# Patient Record
Sex: Female | Born: 1980
Health system: Southern US, Community
[De-identification: ages and names within clinical notes are randomized; demographics above are authoritative.]

## PROBLEM LIST (undated history)

## (undated) DIAGNOSIS — N870 Mild cervical dysplasia: Secondary | ICD-10-CM

## (undated) DIAGNOSIS — R87612 Low grade squamous intraepithelial lesion on cytologic smear of cervix (LGSIL): Secondary | ICD-10-CM

## (undated) DIAGNOSIS — Z789 Other specified health status: Secondary | ICD-10-CM

## (undated) HISTORY — PX: COLPOSCOPY: SHX161

## (undated) HISTORY — PX: REFRACTIVE SURGERY: SHX103

## (undated) HISTORY — DX: Low grade squamous intraepithelial lesion on cytologic smear of cervix (LGSIL): R87.612

## (undated) HISTORY — DX: Mild cervical dysplasia: N87.0

## (undated) HISTORY — DX: Other specified health status: Z78.9

---

## 2002-11-04 ENCOUNTER — Other Ambulatory Visit: Admission: RE | Admit: 2002-11-04 | Discharge: 2002-11-04 | Payer: Self-pay | Admitting: Gynecology

## 2003-11-06 ENCOUNTER — Other Ambulatory Visit: Admission: RE | Admit: 2003-11-06 | Discharge: 2003-11-06 | Payer: Self-pay | Admitting: Gynecology

## 2004-11-29 ENCOUNTER — Other Ambulatory Visit: Admission: RE | Admit: 2004-11-29 | Discharge: 2004-11-29 | Payer: Self-pay | Admitting: Obstetrics and Gynecology

## 2005-08-06 ENCOUNTER — Other Ambulatory Visit: Admission: RE | Admit: 2005-08-06 | Discharge: 2005-08-06 | Payer: Self-pay | Admitting: Gynecology

## 2006-02-11 ENCOUNTER — Other Ambulatory Visit: Admission: RE | Admit: 2006-02-11 | Discharge: 2006-02-11 | Payer: Self-pay | Admitting: Gynecology

## 2007-04-01 ENCOUNTER — Other Ambulatory Visit: Admission: RE | Admit: 2007-04-01 | Discharge: 2007-04-01 | Payer: Self-pay | Admitting: Gynecology

## 2007-06-17 DIAGNOSIS — Z789 Other specified health status: Secondary | ICD-10-CM

## 2007-06-17 HISTORY — DX: Other specified health status: Z78.9

## 2007-07-07 ENCOUNTER — Other Ambulatory Visit: Admission: RE | Admit: 2007-07-07 | Discharge: 2007-07-07 | Payer: Self-pay | Admitting: Gynecology

## 2009-05-02 ENCOUNTER — Other Ambulatory Visit: Admission: RE | Admit: 2009-05-02 | Discharge: 2009-05-02 | Payer: Self-pay | Admitting: Gynecology

## 2009-05-02 ENCOUNTER — Ambulatory Visit: Payer: Self-pay | Admitting: Women's Health

## 2010-10-24 ENCOUNTER — Other Ambulatory Visit (HOSPITAL_COMMUNITY)
Admission: RE | Admit: 2010-10-24 | Discharge: 2010-10-24 | Disposition: A | Discharge status: Home / Self Care | Payer: PRIVATE HEALTH INSURANCE | Source: Ambulatory Visit | Attending: Gynecology | Admitting: Gynecology

## 2010-10-24 ENCOUNTER — Encounter (INDEPENDENT_AMBULATORY_CARE_PROVIDER_SITE_OTHER): Payer: PRIVATE HEALTH INSURANCE | Admitting: Women's Health

## 2010-10-24 ENCOUNTER — Other Ambulatory Visit: Payer: Self-pay | Admitting: Gynecology

## 2010-10-24 DIAGNOSIS — Z01419 Encounter for gynecological examination (general) (routine) without abnormal findings: Secondary | ICD-10-CM

## 2010-10-24 DIAGNOSIS — B3731 Acute candidiasis of vulva and vagina: Secondary | ICD-10-CM

## 2010-10-24 DIAGNOSIS — B373 Candidiasis of vulva and vagina: Secondary | ICD-10-CM

## 2010-10-24 DIAGNOSIS — Z124 Encounter for screening for malignant neoplasm of cervix: Secondary | ICD-10-CM | POA: Insufficient documentation

## 2010-11-26 ENCOUNTER — Ambulatory Visit (INDEPENDENT_AMBULATORY_CARE_PROVIDER_SITE_OTHER): Payer: Self-pay | Admitting: Women's Health

## 2010-11-26 DIAGNOSIS — N898 Other specified noninflammatory disorders of vagina: Secondary | ICD-10-CM

## 2010-11-26 DIAGNOSIS — B373 Candidiasis of vulva and vagina: Secondary | ICD-10-CM

## 2011-02-21 ENCOUNTER — Telehealth: Payer: Self-pay | Admitting: *Deleted

## 2011-02-21 ENCOUNTER — Ambulatory Visit (INDEPENDENT_AMBULATORY_CARE_PROVIDER_SITE_OTHER): Payer: 59 | Admitting: Gynecology

## 2011-02-21 ENCOUNTER — Encounter: Payer: Self-pay | Admitting: Gynecology

## 2011-02-21 DIAGNOSIS — R350 Frequency of micturition: Secondary | ICD-10-CM

## 2011-02-21 DIAGNOSIS — R82998 Other abnormal findings in urine: Secondary | ICD-10-CM

## 2011-02-21 DIAGNOSIS — N390 Urinary tract infection, site not specified: Secondary | ICD-10-CM

## 2011-02-21 MED ORDER — SULFAMETHOXAZOLE-TRIMETHOPRIM 800-160 MG PO TABS: 1.0000 | ORAL_TABLET | Freq: Two times a day (BID) | ORAL | Status: AC

## 2011-02-21 NOTE — Progress Notes (Signed)
Patient presents with classic UTI symptoms of one day duration of frequency and mild dysuria. No fevers or chills nausea vomiting or low back pain. She does note that her urine is also darker. She does have a history of UTIs in the past.  Exam Back: Spine straight no CVA tenderness Abdominal: Soft nontender without masses guarding rebound organomegaly  urinalysis: 2-3 WBC 1-2 RBC few epithelial trace bacteria positive leukocyte esterase small amount of blood.  Assessment and plan: Early UTI. Will treat with Septra DS 1 by mouth twice a day x3 days follow up if symptoms persist or recur.

## 2011-02-21 NOTE — Telephone Encounter (Signed)
Pt called stating pharmacy never got medication for septra ds. Rx [facility].

## 2011-08-11 ENCOUNTER — Telehealth: Payer: Self-pay | Admitting: *Deleted

## 2011-08-11 ENCOUNTER — Other Ambulatory Visit: Payer: Self-pay | Admitting: Women's Health

## 2011-08-11 MED ORDER — ETONOGESTREL-ETHINYL ESTRADIOL 0.12-0.015 MG/24HR VA RING: VAGINAL_RING | VAGINAL | Status: DC

## 2011-08-11 NOTE — Telephone Encounter (Signed)
Pt called requesting to start back on birth control. She used nuvaring in the past and would like to start back on it. Her LMP: around the week of Jan 28. Pt said she couldn't remember. According to chart looks like her last use of nuvaring was back in 2010. Please advise

## 2011-08-11 NOTE — Telephone Encounter (Signed)
Telephone call to review request. States cycles are irregular every 3-5 weeks some lasting 5-7 days and would like to get back on nuva ring for cycle control. Husband with vasectomy. Start up instructions reviewed will call if her cycle does not regulate, will evaluate at annual exam in May. Nonsmoker

## 2011-08-18 ENCOUNTER — Encounter: Payer: Self-pay | Admitting: Women's Health

## 2011-08-18 ENCOUNTER — Ambulatory Visit (INDEPENDENT_AMBULATORY_CARE_PROVIDER_SITE_OTHER): Payer: 59 | Admitting: Women's Health

## 2011-08-18 DIAGNOSIS — N898 Other specified noninflammatory disorders of vagina: Secondary | ICD-10-CM

## 2011-08-18 DIAGNOSIS — L293 Anogenital pruritus, unspecified: Secondary | ICD-10-CM

## 2011-08-18 LAB — WET PREP FOR TRICH, YEAST, CLUE
Clue Cells Wet Prep HPF POC: NONE SEEN
Trich, Wet Prep: NONE SEEN
Yeast Wet Prep HPF POC: NONE SEEN

## 2011-08-18 MED ORDER — FLUCONAZOLE 100 MG PO TABS: 100.0000 mg | ORAL_TABLET | Freq: Every day | ORAL | Status: AC

## 2011-08-18 NOTE — Progress Notes (Signed)
Patient ID: Kelly Tapia, female   DOB: 09-16-1980, 31 y.o.   MRN: [id number] Presents with the complaint of external vaginal itching, denies any odor. Took  Diflucan twice last week 75% better but continues with some itching, cycle started today. History of yeast in the past. Husband with vasectomy, uses nuva ring for dysmenorrhea with good relief. Denies any urinary symptoms.  Exam: External genitalia slightly erythematous. Speculum exam moderate amount of menses-type blood noted vaginal walls also erythematous. Wet prep negative for yeast. Bimanual no CMT or adnexal fullness or tenderness.  Yeast symptoms  Plan: Diflucan 200 for 3 days days, instructed to call office if no relief.

## 2011-11-14 ENCOUNTER — Other Ambulatory Visit: Payer: Self-pay | Admitting: *Deleted

## 2011-11-14 MED ORDER — ETONOGESTREL-ETHINYL ESTRADIOL 0.12-0.015 MG/24HR VA RING: VAGINAL_RING | VAGINAL | Status: DC

## 2011-12-04 ENCOUNTER — Ambulatory Visit (INDEPENDENT_AMBULATORY_CARE_PROVIDER_SITE_OTHER): Payer: 59 | Admitting: Women's Health

## 2011-12-04 ENCOUNTER — Other Ambulatory Visit (HOSPITAL_COMMUNITY)
Admission: RE | Admit: 2011-12-04 | Discharge: 2011-12-04 | Disposition: A | Discharge status: Home / Self Care | Payer: 59 | Source: Ambulatory Visit | Attending: Women's Health | Admitting: Women's Health

## 2011-12-04 ENCOUNTER — Encounter: Payer: Self-pay | Admitting: Women's Health

## 2011-12-04 VITALS — BP 116/80 | Ht 67.0 in | Wt 184.0 lb

## 2011-12-04 DIAGNOSIS — Z01419 Encounter for gynecological examination (general) (routine) without abnormal findings: Secondary | ICD-10-CM

## 2011-12-04 DIAGNOSIS — Z1159 Encounter for screening for other viral diseases: Secondary | ICD-10-CM | POA: Insufficient documentation

## 2011-12-04 DIAGNOSIS — Z833 Family history of diabetes mellitus: Secondary | ICD-10-CM

## 2011-12-04 DIAGNOSIS — N946 Dysmenorrhea, unspecified: Secondary | ICD-10-CM

## 2011-12-04 MED ORDER — ETONOGESTREL-ETHINYL ESTRADIOL 0.12-0.015 MG/24HR VA RING: VAGINAL_RING | VAGINAL | Status: DC

## 2011-12-04 NOTE — Patient Instructions (Signed)

## 2011-12-04 NOTE — Progress Notes (Signed)
Kelly Tapia May 07, 1981 [id number]    History:    The patient presents for annual exam.  Monthly 4 days cycle on nuva  ring/dysmenorrhea. Husband vasectomy. 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 Has not had gardasil vaccine.   Past medical history, past surgical history, family history and social history were all reviewed and documented in the EPIC chart. 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   ROS:  A  ROS was performed and pertinent positives and negatives are included in the history.  Exam:  Filed Vitals:   12/04/11 1424  BP: 116/80    General appearance:  Normal Head/Neck:  Normal, without cervical or supraclavicular adenopathy. Thyroid:  Symmetrical, normal in size, without palpable masses or nodularity. Respiratory  Effort:  Normal  Auscultation:  Clear without wheezing or rhonchi Cardiovascular  Auscultation:  Regular rate, without rubs, murmurs or gallops  Edema/varicosities:  Not grossly evident Abdominal  Soft,nontender, without masses, guarding or rebound.  Liver/spleen:  No organomegaly noted  Hernia:  None appreciated  Skin  Inspection:  Grossly normal  Palpation:  Grossly normal Neurologic/psychiatric  Orientation:  Normal with appropriate conversation.  Mood/affect:  Normal  Genitourinary    Breasts: Examined lying and sitting.     Right: Without masses, retractions, discharge or axillary adenopathy.     Left: Without masses, retractions, discharge or axillary adenopathy.   Inguinal/mons:  Normal without inguinal adenopathy  External genitalia:  Normal  BUS/Urethra/Skene's glands:  Normal  Bladder:  Normal  Vagina:  Normal  Cervix:  Normal  Uterus:   normal in size, shape and contour.  Midline and mobile  Adnexa/parametria:     Rt: Without masses or tenderness.   Lt: Without masses or tenderness.  Anus and perineum: Normal  Digital rectal exam: Normal sphincter tone without palpated masses or  tenderness  Assessment/Plan:  31 y.o. M. WF G0 for annual exam with no complaints.  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 SBE's, exercise, calcium rich diet, MVI daily encouraged. CBC, glucose, UA and Pap with HR HPV typing. Reviewed new Pap screening guidelines.     Harrington Challenger [facility], 5:55 PM 12/04/2011

## 2011-12-05 LAB — URINALYSIS W MICROSCOPIC + REFLEX CULTURE
Hgb urine dipstick: NEGATIVE
Leukocytes, UA: NEGATIVE
Protein, ur: NEGATIVE mg/dL
Squamous Epithelial / LPF: NONE SEEN
Urobilinogen, UA: 0.2 mg/dL (ref 0.0–1.0)

## 2011-12-05 LAB — CBC WITH DIFFERENTIAL/PLATELET
Basophils Absolute: 0 10*3/uL (ref 0.0–0.1)
Basophils Relative: 0 % (ref 0–1)
Eosinophils Absolute: 0.1 10*3/uL (ref 0.0–0.7)
Eosinophils Relative: 1 % (ref 0–5)
Lymphs Abs: 2.5 10*3/uL (ref 0.7–4.0)
MCH: 30.9 pg (ref 26.0–34.0)
MCHC: 33.2 g/dL (ref 30.0–36.0)
MCV: 93 fL (ref 78.0–100.0)
Platelets: 347 10*3/uL (ref 150–400)
RDW: 13.3 % (ref 11.5–15.5)

## 2012-09-16 ENCOUNTER — Telehealth: Payer: Self-pay | Admitting: *Deleted

## 2012-09-16 NOTE — Telephone Encounter (Signed)
Pt takes nuvaring currently, wants to switch birth control. Pt told to make OV, transferred to front desk.

## 2012-09-23 ENCOUNTER — Ambulatory Visit: Payer: Self-pay | Admitting: Women's Health

## 2012-11-19 ENCOUNTER — Ambulatory Visit (INDEPENDENT_AMBULATORY_CARE_PROVIDER_SITE_OTHER): Payer: 59 | Admitting: Sports Medicine

## 2012-11-19 ENCOUNTER — Encounter: Payer: Self-pay | Admitting: Sports Medicine

## 2012-11-19 VITALS — BP 125/77 | HR 89 | Wt 196.0 lb

## 2012-11-19 DIAGNOSIS — C44612 Basal cell carcinoma of skin of right upper limb, including shoulder: Secondary | ICD-10-CM | POA: Insufficient documentation

## 2012-11-19 DIAGNOSIS — Z299 Encounter for prophylactic measures, unspecified: Secondary | ICD-10-CM

## 2012-11-19 DIAGNOSIS — L989 Disorder of the skin and subcutaneous tissue, unspecified: Secondary | ICD-10-CM

## 2012-11-19 NOTE — Progress Notes (Signed)
  Subjective:    CC: Establish care.   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 She is very healthy and has no complaints with the exception of a lesion that she is noted on her back.  Mole: Presents on the left mid back, growing slightly per patient, it does not bleed, it does not cause pain, she wonders if it could be a skin cancer.  Preventive measures: Up-to-date on everything including Pap smears.  Past medical history, Surgical history, Family history not pertinant except as noted below, Social history, Allergies, and medications have been entered into the medical record, reviewed, and no changes needed.   Review of Systems: No headache, visual changes, nausea, vomiting, diarrhea, constipation, dizziness, abdominal pain, skin rash, fevers, chills, night sweats, swollen lymph nodes, weight loss, chest pain, body aches, joint swelling, muscle aches, shortness of breath, mood changes, visual or auditory hallucinations.  Objective:    General: Well Developed, well nourished, and in no acute distress.  Neuro: Alert and oriented x3, extra-ocular muscles intact, sensation grossly intact.  HEENT: Normocephalic, atraumatic, pupils equal round reactive to light, neck supple, no masses, no lymphadenopathy, thyroid nonpalpable.  Skin: Warm and dry, no rashes noted.  There is a 1 cm plaque on her left mid back that does have a variegated appearance, it is slightly raised and does take on the appearance of a seborrheic keratosis, but does not have well-defined borders. Cardiac: Regular rate and rhythm, no murmurs rubs or gallops.  Respiratory: Clear to auscultation bilaterally. Not using accessory muscles, speaking in full sentences.  Abdominal: Soft, nontender, nondistended, positive bowel sounds, no masses, no organomegaly.  Musculoskeletal: Shoulder, elbow, wrist, hip, knee, ankle stable, and with full range of  motion.  Impression and Recommendations:    The patient was counselled, risk factors were discussed, anticipatory guidance given.

## 2012-11-19 NOTE — Assessment & Plan Note (Signed)
Up-to-date on Pap smear. Checking routine blood work.

## 2012-11-19 NOTE — Assessment & Plan Note (Signed)
This does have benign features however because it has been growing I am going to perform excisional biopsy. She will come back to see me at my next available 15 minute slot to do this procedure.

## 2012-12-11 LAB — LIPID PANEL
Cholesterol: 147 mg/dL (ref 0–200)
HDL: 48 mg/dL (ref >39)
LDL Cholesterol: 88 mg/dL (ref 0–99)
Total CHOL/HDL Ratio: 3.1 ratio
Triglycerides: 55 mg/dL (ref <150)
VLDL: 11 mg/dL (ref 0–40)

## 2012-12-11 LAB — COMPREHENSIVE METABOLIC PANEL WITH GFR
ALT: 29 U/L (ref 0–35)
AST: 21 U/L (ref 0–37)
Albumin: 4 g/dL (ref 3.5–5.2)
Alkaline Phosphatase: 51 U/L (ref 39–117)
BUN: 14 mg/dL (ref 6–23)
Creat: 0.9 mg/dL (ref 0.50–1.10)
Potassium: 5.3 meq/L (ref 3.5–5.3)

## 2012-12-11 LAB — CBC
HCT: 37.2 % (ref 36.0–46.0)
Hemoglobin: 12.6 g/dL (ref 12.0–15.0)
MCH: 30.6 pg (ref 26.0–34.0)
MCHC: 33.9 g/dL (ref 30.0–36.0)
MCV: 90.3 fL (ref 78.0–100.0)
Platelets: 313 K/uL (ref 150–400)
RBC: 4.12 MIL/uL (ref 3.87–5.11)
RDW: 12.8 % (ref 11.5–15.5)
WBC: 5.2 10*3/uL (ref 4.0–10.5)

## 2012-12-11 LAB — COMPREHENSIVE METABOLIC PANEL
CO2: 27 mEq/L (ref 19–32)
Calcium: 9.2 mg/dL (ref 8.4–10.5)
Chloride: 106 mEq/L (ref 96–112)
Glucose, Bld: 91 mg/dL (ref 70–99)
Sodium: 139 mEq/L (ref 135–145)
Total Bilirubin: 0.5 mg/dL (ref 0.3–1.2)
Total Protein: 6.6 g/dL (ref 6.0–8.3)

## 2012-12-11 LAB — TSH: TSH: 0.723 u[IU]/mL (ref 0.350–4.500)

## 2012-12-12 LAB — HEMOGLOBIN A1C
Hgb A1c MFr Bld: 5.2 % (ref <5.7)
Mean Plasma Glucose: 103 mg/dL (ref <117)

## 2012-12-13 LAB — VITAMIN D 25 HYDROXY (VIT D DEFICIENCY, FRACTURES): Vit D, 25-Hydroxy: 51 ng/mL (ref 30–89)

## 2012-12-22 ENCOUNTER — Other Ambulatory Visit (HOSPITAL_COMMUNITY)
Admission: RE | Admit: 2012-12-22 | Discharge: 2012-12-22 | Disposition: A | Discharge status: Home / Self Care | Payer: 59 | Source: Ambulatory Visit | Attending: Gynecology | Admitting: Gynecology

## 2012-12-22 ENCOUNTER — Encounter: Payer: Self-pay | Admitting: Women's Health

## 2012-12-22 ENCOUNTER — Ambulatory Visit (INDEPENDENT_AMBULATORY_CARE_PROVIDER_SITE_OTHER): Payer: 59 | Admitting: Women's Health

## 2012-12-22 VITALS — BP 114/72 | Ht 66.5 in | Wt 197.0 lb

## 2012-12-22 DIAGNOSIS — Z01419 Encounter for gynecological examination (general) (routine) without abnormal findings: Secondary | ICD-10-CM | POA: Insufficient documentation

## 2012-12-22 DIAGNOSIS — N920 Excessive and frequent menstruation with regular cycle: Secondary | ICD-10-CM

## 2012-12-22 DIAGNOSIS — N898 Other specified noninflammatory disorders of vagina: Secondary | ICD-10-CM

## 2012-12-22 LAB — WET PREP FOR TRICH, YEAST, CLUE
Trich, Wet Prep: NONE SEEN
Yeast Wet Prep HPF POC: NONE SEEN

## 2012-12-22 MED ORDER — METRONIDAZOLE 0.75 % VA GEL: VAGINAL | Status: DC

## 2012-12-22 NOTE — Patient Instructions (Addendum)

## 2012-12-22 NOTE — Progress Notes (Signed)
Kelly Tapia [birth year] [id number]    History:    The patient presents for annual exam.  Monthly cycle for 7-9 days, 7 are heavy using pads and tampons and changing every one to 2 hours. Husband vasectomy desires no children. History of CIN-1 in 2006, benign biopsy with equivocal HR HPV in 2007. Normal CBC, TSH, glucose at primary care 11/2012.  Past medical history, past surgical history, family history and social history were all reviewed and documented in the EPIC chart. 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  ROS:  A  ROS was performed and pertinent positives and negatives are included in the history.  Exam:  Filed Vitals:   12/22/12 1558  BP: 114/72    General appearance:  Normal Head/Neck:  Normal, without cervical or supraclavicular adenopathy. Thyroid:  Symmetrical, normal in size, without palpable masses or nodularity. Respiratory  Effort:  Normal  Auscultation:  Clear without wheezing or rhonchi Cardiovascular  Auscultation:  Regular rate, without rubs, murmurs or gallops  Edema/varicosities:  Not grossly evident Abdominal  Soft,nontender, without masses, guarding or rebound.  Liver/spleen:  No organomegaly noted  Hernia:  None appreciated  Skin  Inspection:  Grossly normal  Palpation:  Grossly normal Neurologic/psychiatric  Orientation:  Normal with appropriate conversation.  Mood/affect:  Normal  Genitourinary    Breasts: Examined lying and sitting.     Right: Without masses, retractions, discharge or axillary adenopathy.     Left: Without masses, retractions, discharge or axillary adenopathy.   Inguinal/mons:  Normal without inguinal adenopathy  External genitalia:  Normal/ piercing  BUS/Urethra/Skene's glands:  Normal  Bladder:  Normal  Vagina:  Milky discharge, wet prep positive for clues and TNTC bacteria  Cervix:  Normal  Uterus:   normal in size, shape and contour.  Midline and mobile  Adnexa/parametria:     Rt: Without masses or  tenderness.   Lt: Without masses or tenderness.  Anus and perineum: Normal  Digital rectal exam: Normal sphincter tone without palpated masses or tenderness  Assessment/Plan:  32 y.o. MWF G0 for annual exam.     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 Reviewed fertility, importance of 100% being sure of no desire to conceive. Schedule sonohysterogram with Dr. Audie Box after next cycle. Her option ablation information given and reviewed. SBE's, continue regular exercise, calcium rich diet, decrease calories for weight loss, sun screen, MVI daily encouraged. MetroGel vaginal cream 1 applicator at bedtime x5, alcohol for cautions reviewed. Prolactin,  Pap.    Harrington Challenger [facility], 4:54 PM 12/22/2012

## 2013-01-07 ENCOUNTER — Other Ambulatory Visit: Payer: Self-pay | Admitting: Women's Health

## 2013-01-07 NOTE — Telephone Encounter (Signed)
Telephone call, at office visit her option ablation discussed due to menorrhagia, almost $3000 out-of-pocket, will use NuvaRing again. Reviewed risks of blood clots and strokes, nonsmoker.

## 2013-01-10 ENCOUNTER — Encounter: Payer: Self-pay | Admitting: Sports Medicine

## 2013-01-10 ENCOUNTER — Ambulatory Visit (INDEPENDENT_AMBULATORY_CARE_PROVIDER_SITE_OTHER): Payer: 59 | Admitting: Sports Medicine

## 2013-01-10 VITALS — BP 133/70 | HR 74 | Wt 197.0 lb

## 2013-01-10 DIAGNOSIS — R309 Painful micturition, unspecified: Secondary | ICD-10-CM

## 2013-01-10 DIAGNOSIS — R350 Frequency of micturition: Secondary | ICD-10-CM

## 2013-01-10 DIAGNOSIS — N3090 Cystitis, unspecified without hematuria: Secondary | ICD-10-CM

## 2013-01-10 DIAGNOSIS — R300 Dysuria: Secondary | ICD-10-CM

## 2013-01-10 LAB — POCT URINALYSIS DIPSTICK
Bilirubin, UA: NEGATIVE
Glucose, UA: NEGATIVE
Ketones, UA: NEGATIVE
Nitrite, UA: NEGATIVE
Protein, UA: NEGATIVE
Spec Grav, UA: 1.01
Urobilinogen, UA: 0.2
pH, UA: 7

## 2013-01-10 MED ORDER — FLUCONAZOLE 150 MG PO TABS: 150.0000 mg | ORAL_TABLET | Freq: Once | ORAL | Status: DC

## 2013-01-10 MED ORDER — CEPHALEXIN 500 MG PO CAPS: 500.0000 mg | ORAL_CAPSULE | Freq: Two times a day (BID) | ORAL | Status: DC

## 2013-01-10 MED ORDER — PHENAZOPYRIDINE HCL 200 MG PO TABS: 200.0000 mg | ORAL_TABLET | Freq: Three times a day (TID) | ORAL | Status: AC

## 2013-01-10 NOTE — Progress Notes (Signed)
  Subjective:    CC: Dysuria  HPI: This pleasant young lady has had a couple of days of dysuria, frequency, urgency. No flank pain, no fevers or chills, symptoms are mild, persistent, worsening. They feel like prior UTIs, she does get yeast infections with antibiotics. She is an allergy to penicillin, when she was a child, and consisted only of a rash. She has had Keflex in the past without any problems.  Past medical history, Surgical history, Family history not pertinant except as noted below, Social history, Allergies, and medications have been entered into the medical record, reviewed, and no changes needed.   Review of Systems: No fevers, chills, night sweats, weight loss, chest pain, or shortness of breath.   Objective:    General: Well Developed, well nourished, and in no acute distress.  Neuro: Alert and oriented x3, extra-ocular muscles intact, sensation grossly intact.  HEENT: Normocephalic, atraumatic, pupils equal round reactive to light, neck supple, no masses, no lymphadenopathy, thyroid nonpalpable.  Skin: Warm and dry, no rashes. Cardiac: Regular rate and rhythm, no murmurs rubs or gallops, no lower extremity edema.  Respiratory: Clear to auscultation bilaterally. Not using accessory muscles, speaking in full sentences. Abdomen: Soft, nontender, nondistended, no costovertebral angle tenderness.  Urinalysis: Positive for leuks, blood.  Impression and Recommendations:

## 2013-01-10 NOTE — Assessment & Plan Note (Signed)
Uncomplicated with no symptoms of pyelonephritis. Urine culture, Pyridium, Keflex, Diflucan as she does get yeast infections after antibiotics. Return as needed.

## 2013-01-12 LAB — URINE CULTURE
Colony Count: NO GROWTH
Organism ID, Bacteria: NO GROWTH

## 2013-01-13 ENCOUNTER — Other Ambulatory Visit: Payer: 59

## 2013-01-13 ENCOUNTER — Ambulatory Visit: Payer: 59 | Admitting: Gynecology

## 2013-01-25 ENCOUNTER — Encounter: Payer: Self-pay | Admitting: Sports Medicine

## 2013-01-26 NOTE — Telephone Encounter (Signed)
Kelly Tapia, can you please call lab and have him change the diagnoses associated with all of her blood work to "[id number]."  That way insurance will cover the cost of her blood work, you can see the previous my chart messages that she sent me.

## 2013-02-15 ENCOUNTER — Encounter: Payer: Self-pay | Admitting: Sports Medicine

## 2013-02-15 ENCOUNTER — Ambulatory Visit (INDEPENDENT_AMBULATORY_CARE_PROVIDER_SITE_OTHER): Payer: 59 | Admitting: Sports Medicine

## 2013-02-15 VITALS — BP 122/72 | HR 79 | Wt 193.0 lb

## 2013-02-15 DIAGNOSIS — J029 Acute pharyngitis, unspecified: Secondary | ICD-10-CM | POA: Insufficient documentation

## 2013-02-15 MED ORDER — AZITHROMYCIN 250 MG PO TABS: ORAL_TABLET | ORAL | Status: DC

## 2013-02-15 NOTE — Progress Notes (Signed)
  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 No nausea, no GI symptoms, no rash. No sick contacts as far she can remember.  Past medical history, Surgical history, Family history not pertinant except as noted below, Social history, Allergies, and medications have been entered into the medical record, reviewed, and no changes needed.   Review of Systems: No fevers, chills, night sweats, weight loss, chest pain, or shortness of breath.   Objective:    General: Well Developed, well nourished, and in no acute distress.  Neuro: Alert and oriented x3, extra-ocular muscles intact, sensation grossly intact.  HEENT: Normocephalic, atraumatic, pupils equal round reactive to light, neck supple, no masses, tender bilateral lymphadenopathy, thyroid nonpalpable. Tonsils are swollen with exudates on the right side, nasopharynx and external ear canals are unremarkable. Skin: Warm and dry, no rashes. Pigmented skin lesion on the left side of her back does appear larger than at the last visit. Cardiac: Regular rate and rhythm, no murmurs rubs or gallops, no lower extremity edema.  Respiratory: Clear to auscultation bilaterally. Not using accessory muscles, speaking in full sentences.  Rapid strep test is negative. Impression and Recommendations:

## 2013-02-15 NOTE — Assessment & Plan Note (Signed)
There are 3 and possibly 4 points on the Centor criteria. Rapid strep test is negative, this could likely represent a false negative with 3-4 points of Centor Criteria. Penicillin allergic, treated with azithromycin.

## 2013-03-02 ENCOUNTER — Ambulatory Visit (INDEPENDENT_AMBULATORY_CARE_PROVIDER_SITE_OTHER): Payer: 59 | Admitting: Sports Medicine

## 2013-03-02 ENCOUNTER — Encounter: Payer: Self-pay | Admitting: Sports Medicine

## 2013-03-02 VITALS — BP 137/73 | HR 75 | Wt 198.0 lb

## 2013-03-02 DIAGNOSIS — L989 Disorder of the skin and subcutaneous tissue, unspecified: Secondary | ICD-10-CM

## 2013-03-02 NOTE — Assessment & Plan Note (Signed)
Excision performed in the office, return in one week for a wound check. Lesion sent off for pathology.

## 2013-03-02 NOTE — Addendum Note (Signed)
Addended by: Willey Blade C on: 03/02/2013 11:27 AM   Modules accepted: Orders

## 2013-03-02 NOTE — Progress Notes (Signed)
   Procedure:  Excision of  left-sided back lesion, 2.1 cm. Risks, benefits, and alternatives explained and consent obtained. Time out conducted. Surface prepped with alcohol. 5cc lidocaine with epinephine infiltrated in a field block. Adequate anesthesia ensured. Area prepped and draped in a sterile fashion. Excision performed with: 11 blade used to form an ellipse around the lesion, the lesion was then removed, to use a single running subcuticular stitch with 4-0 Vicryl to close the wound, and applied Steri-Strips. Hemostasis achieved. Pt stable. Lesion will be sent to pathology.

## 2013-03-02 NOTE — Patient Instructions (Addendum)
Excision of Skin Lesions   Excision of a skin lesion refers to the removal of a section of skin by making small cuts (incisions) in the skin. This is typically done to remove a cancerous growth (basal cell carcinoma, squamous cell carcinoma, or melanoma) or a noncancerous growth (cyst). It may be done to treat or prevent cancer or infection. It may also be done to improve cosmetic appearance (removal of mole, skin tag).   LET YOUR CAREGIVER KNOW ABOUT:   Allergies to food or medicine.   Medicines taken, including vitamins, herbs, eyedrops, over-the-counter medicines, and creams.   Use of steroids (by mouth or creams).   Previous problems with anesthetics or numbing medicines.   History of bleeding problems or blood clots.   History of any prostheses.   Previous surgery.   Other health problems, including diabetes and kidney problems.   Possibility of pregnancy, if this applies.  RISKS AND COMPLICATIONS   Many complications can be managed. With appropriate treatment and rehabilitation, the following complications are very uncommon:   Bleeding.   Infection.   Scarring.   Recurrence of cyst or cancer.   Changes in skin sensation or appearance (discoloration, swelling).   Reaction to anesthesia.   Allergic reaction to surgical materials or ointments.   Damage to nerves, blood vessels, muscles, or other structures.   Continued pain.  BEFORE THE PROCEDURE   It is important to follow your caregiver's instructions prior to your procedure to avoid complications. Steps before your procedure may include:   Physical exam, blood tests, other procedures, such as removing a small sample for examination under a microscope (biopsy).   Your caregiver may review the procedure, the anesthesia being used, and what to expect after the procedure with you.  You may be asked to:   Stop taking certain medicines, such as blood thinners (including aspirin, clopidogrel, ibuprofen), for several days prior to your procedure.   Take certain  medicines.   Stop smoking.  It is a good idea to arrange for a ride home after surgery and to have someone to help you with activities during recovery.   PROCEDURE   There are several excision techniques. The type of excision or surgical technique used will depend on your condition, the location of the lesion, and your overall health. After the lesion is sterilized and a local anesthetic is applied, the following may be performed:   Complete surgical excision   The area to be removed is marked with a pen. Using a small scalpel and scissors, the surgeon gently cuts around and under the lesion until it is completely removed. The lesion is placed in a special fluid and sent to the lab for examination. If necessary, bleeding will be controlled with a device that delivers heat. The edges of the wound are stitched together and a dressing is applied. This procedure may be performed to treat a cancerous growth or noncancerous cyst or lesion. Surgeons commonly perform an elliptical excision, to minimize scarring.   Excision of a cyst   The surgeon makes an incision on the cyst. The entire cyst is removed through the incision. The wound may be closed with a suture (stitch).   Shave excision   During shave excision, the surgeon uses a small blade or loop instrument to shave off the lesion. This may be done to remove a mole or skin tag. The wound is usually left to heal on its own without stitches.   Punch excision   During punch excision,   the surgeon uses a small, round tool (like a cookie cutter) to cut a circle shape out of the skin. The outer edges of the skin are stitched together. This may be done to remove a mole or scar or to perform a biopsy of the lesion.   Mohs micrographic surgery   During Mohs micrographic surgery, layers of the lesion are removed with a scalpel or loop instrument and immediately examined under a microscope until all of the abnormal or cancerous tissue is removed. This procedure is minimally  invasive and ensures the best cosmetic outcome, with removal of as little normal tissue as possible. Mohs is usually done to treat skin cancer, such as basal cell carcinoma or squamous cell carcinoma, particularly on the face and ears.   Antibiotic ointment is applied to the surgical area after each of the procedures listed above, as necessary.   AFTER THE PROCEDURE   How well you heal depends on many factors. Most patients heal quite well with proper techniques and self-care. Scarring will lessen over time.   HOME CARE INSTRUCTIONS   Take medicines for pain as directed.   Keep the incision area clean, dry, and protected for at least 48 hours. Change dressings as directed.   For bleeding, apply gentle but firm pressure to the wound using a folded towel for 20 minutes. Call your caregiver if bleeding does not stop.   Avoid high-impact exercise and activities until the stitches are removed or the area heals.   Follow your caregiver's instructions to minimize scarring. Avoid sun exposure until the area has healed. Scarring should lessen over time.   Follow up with your caregiver as directed. Removal of stitches within 4 to 14 days may be necessary.  Finding out the results of your test   Not all test results are available during your visit. If your test results are not back during the visit, make an appointment with your caregiver to find out the results. Do not assume everything is normal if you have not heard from your caregiver or the medical facility. It is important for you to follow up on all of your test results.   SEEK MEDICAL CARE IF:   You or your child has an oral temperature above 102 F (38.9 C).   You develop signs of infection (chills, feeling unwell).   You notice bleeding, pain, discharge, redness, or swelling at the incision site.   You notice skin irregularities or changes in sensation.  MAKE SURE YOU:   Understand these instructions.   Will watch your condition.   Will get help right away if you  are not doing well or get worse.  FOR MORE INFORMATION   American Academy of Family Physicians: www.aafp.org   American Academy of Dermatology: www.aad.org   Document Released: 08/27/2009 Document Revised: 08/25/2011 Document Reviewed: 08/27/2009   [employer] Patient Information 2014 [employer].

## 2013-03-04 ENCOUNTER — Ambulatory Visit (INDEPENDENT_AMBULATORY_CARE_PROVIDER_SITE_OTHER): Payer: 59 | Admitting: Sports Medicine

## 2013-03-04 ENCOUNTER — Encounter: Payer: Self-pay | Admitting: Sports Medicine

## 2013-03-04 ENCOUNTER — Other Ambulatory Visit: Payer: Self-pay | Admitting: Sports Medicine

## 2013-03-04 VITALS — BP 124/76 | HR 54

## 2013-03-04 DIAGNOSIS — L989 Disorder of the skin and subcutaneous tissue, unspecified: Secondary | ICD-10-CM

## 2013-03-04 NOTE — Assessment & Plan Note (Signed)
Reexcision performed in the global period. We will await pathology results.

## 2013-03-04 NOTE — Progress Notes (Signed)
    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 Risks, benefits, and alternatives explained and consent obtained. Time out conducted. Surface prepped with alcohol. 5cc lidocaine with epinephine infiltrated in a field block. Adequate anesthesia ensured. Area prepped and draped in a sterile fashion. Excision performed with: 11 blade used to form an ellipse with approximately 0.5 cm border around the previous incision, the prior scar was then removed, to use a single running subcuticular stitch with 4-0 Vicryl to close the wound, and applied Steri-Strips. A suture was placed on the lateral aspect of the excision specimen. Hemostasis achieved. Pt stable. Lesion will be sent to pathology.

## 2013-03-08 ENCOUNTER — Ambulatory Visit: Payer: 59 | Admitting: Sports Medicine

## 2013-03-11 ENCOUNTER — Encounter: Payer: Self-pay | Admitting: Sports Medicine

## 2013-03-11 ENCOUNTER — Ambulatory Visit: Payer: 59 | Admitting: Sports Medicine

## 2013-03-11 VITALS — BP 130/75 | HR 72 | Wt 197.0 lb

## 2013-03-11 DIAGNOSIS — L989 Disorder of the skin and subcutaneous tissue, unspecified: Secondary | ICD-10-CM

## 2013-03-11 NOTE — Assessment & Plan Note (Signed)
Reexcision had clear margins. Wound looks good, return as needed.

## 2013-03-11 NOTE — Progress Notes (Signed)
  Subjective: Doing extremely well after reexcision, margins were clear on pathology results. No pain.   Objective: General: Well-developed, well-nourished, and in no acute distress. Incision looks good, clean, dry, intact, Steri-Strips in place.  Assessment/plan:

## 2013-04-21 ENCOUNTER — Other Ambulatory Visit: Payer: Self-pay

## 2013-05-19 ENCOUNTER — Ambulatory Visit (INDEPENDENT_AMBULATORY_CARE_PROVIDER_SITE_OTHER): Payer: 59 | Admitting: Sports Medicine

## 2013-05-19 DIAGNOSIS — J069 Acute upper respiratory infection, unspecified: Secondary | ICD-10-CM

## 2013-05-19 DIAGNOSIS — L989 Disorder of the skin and subcutaneous tissue, unspecified: Secondary | ICD-10-CM

## 2013-05-19 DIAGNOSIS — J029 Acute pharyngitis, unspecified: Secondary | ICD-10-CM

## 2013-05-19 LAB — POCT RAPID STREP A (OFFICE): Rapid Strep A Screen: NEGATIVE

## 2013-05-19 MED ORDER — IBUPROFEN 800 MG PO TABS: 800.0000 mg | ORAL_TABLET | Freq: Three times a day (TID) | ORAL | Status: DC | PRN

## 2013-05-19 NOTE — Patient Instructions (Signed)

## 2013-05-19 NOTE — Assessment & Plan Note (Signed)
With mild cough. No signs of sinusitis, does not look like strep. Ibuprofen 800, hydration, and if no better in 2 weeks return and we can consider antibiotics.

## 2013-05-19 NOTE — Progress Notes (Signed)
  Subjective:    CC: Sore throat  HPI: For several days now Kelly Tapia has had a mild sore throat associated with a mild cough, minimal sinus pain but no pressure, mild nasal discharge and predominant congestion. Symptoms are mild, persistent, no GI symptoms, multiple sick contacts, no ear pain, no tooth pain, no skin rashes. No shortness of breath, or chest pain.  Past medical history, Surgical history, Family history not pertinant except as noted below, Social history, Allergies, and medications have been entered into the medical record, reviewed, and no changes needed.   Review of Systems: No fevers, chills, night sweats, weight loss, chest pain, or shortness of breath.   Objective:    General: Well Developed, well nourished, and in no acute distress.  Neuro: Alert and oriented x3, extra-ocular muscles intact, sensation grossly intact.  HEENT: Normocephalic, atraumatic, pupils equal round reactive to light, neck supple, no masses, no lymphadenopathy, thyroid nonpalpable. Nasopharynx, external ear canals are essentially unremarkable, there is possibly extremely mild erythema of the oropharynx. Skin: Warm and dry, no rashes. Cardiac: Regular rate and rhythm, no murmurs rubs or gallops, no lower extremity edema.  Respiratory: Clear to auscultation bilaterally. Not using accessory muscles, speaking in full sentences.  Impression and Recommendations:

## 2013-05-19 NOTE — Assessment & Plan Note (Signed)
Patient did go to the gym and tore out some of the stitches. I have advised her we can continue to watch and let it fade, inject to try to make the hypertrophic scar shrink, or revise the scar with an additional incision. I would likely need to place a horizontal mattress suture in addition to running subcuticular.

## 2013-08-30 ENCOUNTER — Ambulatory Visit (INDEPENDENT_AMBULATORY_CARE_PROVIDER_SITE_OTHER): Payer: 59 | Admitting: Women's Health

## 2013-08-30 DIAGNOSIS — N760 Acute vaginitis: Secondary | ICD-10-CM

## 2013-08-30 DIAGNOSIS — N898 Other specified noninflammatory disorders of vagina: Secondary | ICD-10-CM

## 2013-08-30 DIAGNOSIS — B9689 Other specified bacterial agents as the cause of diseases classified elsewhere: Secondary | ICD-10-CM

## 2013-08-30 DIAGNOSIS — A499 Bacterial infection, unspecified: Secondary | ICD-10-CM

## 2013-08-30 LAB — WET PREP FOR TRICH, YEAST, CLUE
Trich, Wet Prep: NONE SEEN
YEAST WET PREP: NONE SEEN

## 2013-08-30 MED ORDER — METRONIDAZOLE 500 MG PO TABS: 500.0000 mg | ORAL_TABLET | Freq: Two times a day (BID) | ORAL | Status: DC

## 2013-08-30 NOTE — Progress Notes (Addendum)
Patient ID: Kelly Tapia, female   DOB: 1981/06/13, 33 y.o.   MRN: [id number] Presents with complaint of vaginal discharge with irritation, odor, and mild itching. Had used  left over MetroGel with minimal relief. New partner. Contraceptives NuvaRing. Denies abdominal pain, fever, or urinary symptoms.  Exam: Appears well. External genitalia within normal limits, speculum exam scant discharge with odor noted. GC/Chlamydia culture taken. Bimanual no CMT or adnexal fullness or tenderness. Wet prep positive for amines, clues, TNTC bacteria.  Bacteria vaginosis STD screen  Plan: Flagyl 500 twice daily for 7 days, alcohol precautions reviewed. GC/Chlamydia culture pending. We'll check HIV, hepatitis, RPR at annual exam in July. Condoms encouraged until permanent partner. Instructed to call if no relief of discharge.

## 2013-08-30 NOTE — Patient Instructions (Signed)
Bacterial Vaginosis Bacterial vaginosis is an infection of the vagina. It happens when too many of certain germs (bacteria) grow in the vagina. HOME CARE  Take your medicine as told by your doctor.  Finish your medicine even if you start to feel better.  Do not have sex until you finish your medicine and are better.  Tell your sex partner that you have an infection. They should see their doctor for treatment.  Practice safe sex. Use condoms. Have only one sex partner. GET HELP IF:  You are not getting better after 3 days of treatment.  You have more grey fluid (discharge) coming from your vagina than before.  You have more pain than before.  You have a fever. MAKE SURE YOU:   Understand these instructions.  Will watch your condition.  Will get help right away if you are not doing well or get worse. Document Released: 03/11/2008 Document Revised: 03/23/2013 Document Reviewed: 01/12/2013 [employer] Patient Information 2014 [employer].  

## 2013-08-31 LAB — GC/CHLAMYDIA PROBE AMP
CT PROBE, AMP APTIMA: NEGATIVE
GC Probe RNA: NEGATIVE

## 2013-12-27 ENCOUNTER — Other Ambulatory Visit (HOSPITAL_COMMUNITY)
Admission: RE | Admit: 2013-12-27 | Discharge: 2013-12-27 | Disposition: A | Discharge status: Home / Self Care | Payer: 59 | Source: Ambulatory Visit | Attending: Women's Health | Admitting: Women's Health

## 2013-12-27 ENCOUNTER — Encounter: Payer: Self-pay | Admitting: Women's Health

## 2013-12-27 ENCOUNTER — Ambulatory Visit (INDEPENDENT_AMBULATORY_CARE_PROVIDER_SITE_OTHER): Payer: 59 | Admitting: Women's Health

## 2013-12-27 VITALS — BP 120/76 | Ht 67.0 in | Wt 182.0 lb

## 2013-12-27 DIAGNOSIS — Z113 Encounter for screening for infections with a predominantly sexual mode of transmission: Secondary | ICD-10-CM

## 2013-12-27 DIAGNOSIS — B9689 Other specified bacterial agents as the cause of diseases classified elsewhere: Secondary | ICD-10-CM

## 2013-12-27 DIAGNOSIS — N760 Acute vaginitis: Secondary | ICD-10-CM

## 2013-12-27 DIAGNOSIS — Z01419 Encounter for gynecological examination (general) (routine) without abnormal findings: Secondary | ICD-10-CM | POA: Insufficient documentation

## 2013-12-27 DIAGNOSIS — A499 Bacterial infection, unspecified: Secondary | ICD-10-CM

## 2013-12-27 DIAGNOSIS — Z1151 Encounter for screening for human papillomavirus (HPV): Secondary | ICD-10-CM | POA: Insufficient documentation

## 2013-12-27 DIAGNOSIS — Z3040 Encounter for surveillance of contraceptives, unspecified: Secondary | ICD-10-CM

## 2013-12-27 LAB — WET PREP FOR TRICH, YEAST, CLUE
Trich, Wet Prep: NONE SEEN
YEAST WET PREP: NONE SEEN

## 2013-12-27 MED ORDER — METRONIDAZOLE 0.75 % VA GEL: 1.0000 | Freq: Two times a day (BID) | VAGINAL | Status: DC

## 2013-12-27 MED ORDER — METRONIDAZOLE 500 MG PO TABS: 500.0000 mg | ORAL_TABLET | Freq: Two times a day (BID) | ORAL | Status: DC

## 2013-12-27 MED ORDER — NUVARING 0.12-0.015 MG/24HR VA RING: VAGINAL_RING | VAGINAL | Status: DC

## 2013-12-27 NOTE — Patient Instructions (Signed)

## 2013-12-27 NOTE — Progress Notes (Signed)
Kelly Tapia May 12, 1981 [id number]    History:    Presents for annual exam.  Regular monthly cycle on NuvaRing/new partner. 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 Received 2 gardasil.   Past medical history, past surgical history, family history and social history were all reviewed and documented in the EPIC chart. 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 Separated from husband denies abuse or infidelity. Mother hypertension.  .  ROS:  A  12 point ROS was performed and pertinent positives and negatives are included.  Exam:  Filed Vitals:   12/27/13 1559  BP: 120/76    General appearance:  Normal Thyroid:  Symmetrical, normal in size, without palpable masses or nodularity. Respiratory  Auscultation:  Clear without wheezing or rhonchi Cardiovascular  Auscultation:  Regular rate, without rubs, murmurs or gallops  Edema/varicosities:  Not grossly evident Abdominal  Soft,nontender, without masses, guarding or rebound.  Liver/spleen:  No organomegaly noted  Hernia:  None appreciated  Skin  Inspection:  Grossly normal   Breasts: Examined lying and sitting.     Right: Without masses, retractions, discharge or axillary adenopathy.     Left: Without masses, retractions, discharge or axillary adenopathy. Gentitourinary   Inguinal/mons:  Normal without inguinal adenopathy  External genitalia:  Normal  BUS/Urethra/Skene's glands:  Normal  Vagina:  Wet prep positive for amines, clues, and TNTC bacteria.  Cervix:  Normal  Uterus:   normal in size, shape and contour.  Midline and mobile  Adnexa/parametria:     Rt: Without masses or tenderness.   Lt: Without masses or tenderness.  Anus and perineum: Normal  Digital rectal exam: Normal sphincter tone without palpated masses or tenderness  Assessment/Plan:  33 y.o. DWF G0 for annual exam with complaint of recurrent bacteria vaginosis.    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 One applicator of MetroGel after menstrual cycle, it continues to persist return to office. Contraception options reviewed, NuvaRing prescription, proper use, slight risk for blood clots and strokes reviewed. Condoms encouraged if sexually active. SBE's, regular exercise, calcium rich diet, MVI daily encouraged. CBC, UA, Pap with HR HPV typing, new screening guidelines reviewed. GC/Chlamydia, HIV, hep B, C., RPR. Counseling encouraged for impending divorce.  Note: This dictation was prepared with Dragon/digital dictation.  Any transcriptional errors that result are unintentional. Huel Cote [facility], 4:49 PM 12/27/2013

## 2013-12-28 LAB — URINALYSIS W MICROSCOPIC + REFLEX CULTURE
Bacteria, UA: NONE SEEN
Bilirubin Urine: NEGATIVE
CRYSTALS: NONE SEEN
Casts: NONE SEEN
Glucose, UA: NEGATIVE mg/dL
Hgb urine dipstick: NEGATIVE
Ketones, ur: NEGATIVE mg/dL
LEUKOCYTES UA: NEGATIVE
Nitrite: NEGATIVE
PROTEIN: NEGATIVE mg/dL
SPECIFIC GRAVITY, URINE: 1.016 (ref 1.005–1.030)
SQUAMOUS EPITHELIAL / LPF: NONE SEEN
UROBILINOGEN UA: 0.2 mg/dL (ref 0.0–1.0)
pH: 5.5 (ref 5.0–8.0)

## 2013-12-28 LAB — GC/CHLAMYDIA PROBE AMP
CT Probe RNA: NEGATIVE
GC Probe RNA: NEGATIVE

## 2013-12-29 LAB — CYTOLOGY - PAP

## 2014-02-02 ENCOUNTER — Encounter: Payer: Self-pay | Admitting: Women's Health

## 2014-02-02 ENCOUNTER — Ambulatory Visit (INDEPENDENT_AMBULATORY_CARE_PROVIDER_SITE_OTHER): Payer: 59 | Admitting: Women's Health

## 2014-02-02 DIAGNOSIS — N390 Urinary tract infection, site not specified: Secondary | ICD-10-CM

## 2014-02-02 LAB — URINALYSIS W MICROSCOPIC + REFLEX CULTURE
Bilirubin Urine: NEGATIVE
CASTS: NONE SEEN
Crystals: NONE SEEN
Glucose, UA: NEGATIVE mg/dL
Ketones, ur: NEGATIVE mg/dL
Nitrite: POSITIVE — AB
PH: 7 (ref 5.0–8.0)
Protein, ur: NEGATIVE mg/dL
Specific Gravity, Urine: 1.015 (ref 1.005–1.030)
Squamous Epithelial / LPF: NONE SEEN
Urobilinogen, UA: 0.2 mg/dL (ref 0.0–1.0)

## 2014-02-02 MED ORDER — CIPROFLOXACIN HCL 250 MG PO TABS: 250.0000 mg | ORAL_TABLET | Freq: Two times a day (BID) | ORAL | Status: DC

## 2014-02-02 NOTE — Patient Instructions (Signed)

## 2014-02-02 NOTE — Progress Notes (Signed)
Patient ID: Kelly Tapia, female   DOB: 1981/05/26, 33 y.o.   MRN: [id number] Presents with complaint of pain and burning especially at end of stream of urination for 2 days. Denies vaginal discharge, abdominal pain or fever. Monthly cycle on NuvaRing, same partner with negative STD screen.  Exam: Appears well. UA: Small leukocytes, positive nitrites, 3-6 WBCs and many bacteria.  UTI  Plan: Cipro 250 twice daily for 3 days prescription, proper use given and reviewed. Instructed to call or return if no relief of symptoms. Urine culture pending.

## 2014-02-04 LAB — URINE CULTURE

## 2014-04-10 ENCOUNTER — Other Ambulatory Visit: Payer: Self-pay | Admitting: Sports Medicine

## 2015-02-02 ENCOUNTER — Ambulatory Visit (INDEPENDENT_AMBULATORY_CARE_PROVIDER_SITE_OTHER): Payer: 59 | Admitting: Sports Medicine

## 2015-02-02 ENCOUNTER — Encounter: Payer: Self-pay | Admitting: Sports Medicine

## 2015-02-02 VITALS — BP 133/82 | HR 85 | Ht 67.0 in | Wt 176.0 lb

## 2015-02-02 DIAGNOSIS — R635 Abnormal weight gain: Secondary | ICD-10-CM

## 2015-02-02 DIAGNOSIS — Z0000 Encounter for general adult medical examination without abnormal findings: Secondary | ICD-10-CM

## 2015-02-02 MED ORDER — TOPIRAMATE 50 MG PO TABS: ORAL_TABLET | ORAL | Status: DC

## 2015-02-02 NOTE — Assessment & Plan Note (Signed)
Patient usually does weight loss at a weight loss clinic, she uses K-25 phentermine tablets. These seem to work better than any other phentermine tablets. She has lost a good 20 pounds, but desires to get down to 150 pounds. She has about 60 phentermine tablets left, I'm going to add Topamax as she does get irritable. Return to see me in one month.

## 2015-02-02 NOTE — Progress Notes (Signed)
  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  Past medical history, Surgical history, Family history not pertinant except as noted below, Social history, Allergies, and medications have been entered into the medical record, reviewed, and no changes needed.   Review of Systems: No headache, visual changes, nausea, vomiting, diarrhea, constipation, dizziness, abdominal pain, skin rash, fevers, chills, night sweats, swollen lymph nodes, weight loss, chest pain, body aches, joint swelling, muscle aches, shortness of breath, mood changes, visual or auditory hallucinations.  Objective:    General: Well Developed, well nourished, and in no acute distress.  Neuro: Alert and oriented x3, extra-ocular muscles intact, sensation grossly intact. Cranial nerves II through XII are intact, motor, sensory, and coordinative functions are all intact. HEENT: Normocephalic, atraumatic, pupils equal round reactive to light, neck supple, no masses, no lymphadenopathy, thyroid nonpalpable. Oropharynx, nasopharynx, external ear canals are unremarkable. Skin: Warm and dry, no rashes noted.  Cardiac: Regular rate and rhythm, no murmurs rubs or gallops.  Respiratory: Clear to auscultation bilaterally. Not using accessory muscles, speaking in full sentences.  Abdominal: Soft, nontender, nondistended, positive bowel sounds, no masses, no organomegaly.  Musculoskeletal: Shoulder, elbow, wrist, hip, knee, ankle stable, and with full range of motion.  Impression and Recommendations:    The  patient was counselled, risk factors were discussed, anticipatory guidance given.

## 2015-02-02 NOTE — Assessment & Plan Note (Signed)
Normal physical as above, ordering routine blood work.

## 2015-02-06 ENCOUNTER — Other Ambulatory Visit: Payer: Self-pay | Admitting: Women's Health

## 2015-02-06 ENCOUNTER — Other Ambulatory Visit: Payer: Self-pay

## 2015-02-06 DIAGNOSIS — Z3040 Encounter for surveillance of contraceptives, unspecified: Secondary | ICD-10-CM

## 2015-02-06 MED ORDER — NUVARING 0.12-0.015 MG/24HR VA RING: VAGINAL_RING | VAGINAL | Status: DC

## 2015-02-23 LAB — COMPREHENSIVE METABOLIC PANEL
ALT: 22 U/L (ref 6–29)
Albumin: 3.7 g/dL (ref 3.6–5.1)
Alkaline Phosphatase: 57 U/L (ref 33–115)
BUN: 16 mg/dL (ref 7–25)
CO2: 25 mmol/L (ref 20–31)
Calcium: 9.1 mg/dL (ref 8.6–10.2)
Chloride: 104 mmol/L (ref 98–110)
Creat: 0.77 mg/dL (ref 0.50–1.10)
Glucose, Bld: 88 mg/dL (ref 65–99)
Sodium: 137 mmol/L (ref 135–146)
Total Protein: 6.6 g/dL (ref 6.1–8.1)

## 2015-02-23 LAB — COMPREHENSIVE METABOLIC PANEL WITH GFR
AST: 27 U/L (ref 10–30)
Potassium: 5.1 mmol/L (ref 3.5–5.3)
Total Bilirubin: 0.4 mg/dL (ref 0.2–1.2)

## 2015-02-23 LAB — CBC
HCT: 38.1 % (ref 36.0–46.0)
Hemoglobin: 12.6 g/dL (ref 12.0–15.0)
MCH: 32.1 pg (ref 26.0–34.0)
MCHC: 33.1 g/dL (ref 30.0–36.0)
MCV: 96.9 fL (ref 78.0–100.0)
MPV: 10.1 fL (ref 8.6–12.4)
Platelets: 312 10*3/uL (ref 150–400)
RBC: 3.93 MIL/uL (ref 3.87–5.11)
RDW: 12.8 % (ref 11.5–15.5)
WBC: 7.3 K/uL (ref 4.0–10.5)

## 2015-02-23 LAB — LIPID PANEL
Cholesterol: 210 mg/dL — ABNORMAL HIGH (ref 125–200)
HDL: 73 mg/dL (ref >=46)
LDL Cholesterol: 116 mg/dL (ref <130)
Total CHOL/HDL Ratio: 2.9 Ratio (ref <=5.0)
Triglycerides: 106 mg/dL (ref <150)
VLDL: 21 mg/dL (ref <30)

## 2015-02-23 LAB — TSH: TSH: 0.874 u[IU]/mL (ref 0.350–4.500)

## 2015-02-23 LAB — HEMOGLOBIN A1C
Hgb A1c MFr Bld: 5.5 % (ref <5.7)
Mean Plasma Glucose: 111 mg/dL (ref <117)

## 2015-02-23 LAB — VITAMIN D 25 HYDROXY (VIT D DEFICIENCY, FRACTURES): Vit D, 25-Hydroxy: 43 ng/mL (ref 30–100)

## 2015-03-01 ENCOUNTER — Encounter: Payer: 59 | Admitting: Women's Health

## 2015-03-02 ENCOUNTER — Ambulatory Visit: Payer: 59 | Admitting: Sports Medicine

## 2015-03-08 ENCOUNTER — Encounter: Payer: Self-pay | Admitting: Women's Health

## 2015-03-08 ENCOUNTER — Ambulatory Visit (INDEPENDENT_AMBULATORY_CARE_PROVIDER_SITE_OTHER): Payer: 59 | Admitting: Women's Health

## 2015-03-08 VITALS — BP 132/80 | Ht 67.0 in | Wt 173.0 lb

## 2015-03-08 DIAGNOSIS — Z3040 Encounter for surveillance of contraceptives, unspecified: Secondary | ICD-10-CM | POA: Diagnosis not present

## 2015-03-08 DIAGNOSIS — Z113 Encounter for screening for infections with a predominantly sexual mode of transmission: Secondary | ICD-10-CM | POA: Diagnosis not present

## 2015-03-08 DIAGNOSIS — Z01419 Encounter for gynecological examination (general) (routine) without abnormal findings: Secondary | ICD-10-CM

## 2015-03-08 MED ORDER — NUVARING 0.12-0.015 MG/24HR VA RING: VAGINAL_RING | VAGINAL | Status: DC

## 2015-03-08 NOTE — Progress Notes (Signed)
Kelly Tapia Sep 30, 1980 [id number]    History:    Presents for annual exam.  Monthly cycle on NuvaRing. 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 Had 2 Gardasil. New partner.   Past medical history, past surgical history, family history and social history were all reviewed and documented in the EPIC chart. 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  ROS:  A ROS was performed and pertinent positives and negatives are included.  Exam:  Filed Vitals:   03/08/15 1519  BP: 132/80    General appearance:  Normal Thyroid:  Symmetrical, normal in size, without palpable masses or nodularity. Respiratory  Auscultation:  Clear without wheezing or rhonchi Cardiovascular  Auscultation:  Regular rate, without rubs, murmurs or gallops  Edema/varicosities:  Not grossly evident Abdominal  Soft,nontender, without masses, guarding or rebound.  Liver/spleen:  No organomegaly noted  Hernia:  None appreciated  Skin  Inspection:  Grossly normal   Breasts: Examined lying and sitting.     Right: Without masses, retractions, discharge or axillary adenopathy.     Left: Without masses, retractions, discharge or axillary adenopathy. Gentitourinary   Inguinal/mons:  Normal without inguinal adenopathy  External genitalia:  Normal  BUS/Urethra/Skene's glands:  Normal  Vagina:  Normal  Cervix:  Normal  Uterus:   normal in size, shape and contour.  Midline and mobile  Adnexa/parametria:     Rt: Without masses or tenderness.   Lt: Without masses or tenderness.  Anus and perineum: Normal  Digital rectal exam: Normal sphincter tone without palpated masses or tenderness  Assessment/Plan:  34 y.o. D WF G0  for annual exam with no complaints.  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 Condoms encouraged until permanent partner.  SBE's, continue regular exercise, calcium rich diet, MVI daily encouraged. UA. Pap normal 2015 with negative HR HPV typing new screening guidelines reviewed. GC/Chlamydia, HIV, hep B, C, RPRHuel Cote [facility], 4:46 PM 03/08/2015

## 2015-03-08 NOTE — Patient Instructions (Signed)

## 2015-03-09 ENCOUNTER — Encounter: Payer: Self-pay | Admitting: Sports Medicine

## 2015-03-09 ENCOUNTER — Ambulatory Visit (INDEPENDENT_AMBULATORY_CARE_PROVIDER_SITE_OTHER): Payer: 59 | Admitting: Sports Medicine

## 2015-03-09 ENCOUNTER — Other Ambulatory Visit: Payer: Self-pay | Admitting: Women's Health

## 2015-03-09 VITALS — BP 129/72 | HR 80 | Ht 67.0 in | Wt 171.0 lb

## 2015-03-09 DIAGNOSIS — R635 Abnormal weight gain: Secondary | ICD-10-CM

## 2015-03-09 LAB — URINALYSIS W MICROSCOPIC + REFLEX CULTURE
BILIRUBIN URINE: NEGATIVE
CASTS: NONE SEEN [LPF]
CRYSTALS: NONE SEEN [HPF]
Glucose, UA: NEGATIVE
HGB URINE DIPSTICK: NEGATIVE
Leukocytes, UA: NEGATIVE
Nitrite: POSITIVE — AB
Protein, ur: NEGATIVE
RBC / HPF: NONE SEEN RBC/HPF (ref <=2)
Specific Gravity, Urine: 1.024 (ref 1.001–1.035)
Yeast: NONE SEEN [HPF]
pH: 6 (ref 5.0–8.0)

## 2015-03-09 LAB — GC/CHLAMYDIA PROBE AMP
CT PROBE, AMP APTIMA: POSITIVE — AB
GC PROBE AMP APTIMA: NEGATIVE

## 2015-03-09 MED ORDER — AZITHROMYCIN 500 MG PO TABS: 1000.0000 mg | ORAL_TABLET | Freq: Every day | ORAL | Status: DC

## 2015-03-09 MED ORDER — TOPIRAMATE 50 MG PO TABS: 50.0000 mg | ORAL_TABLET | Freq: Two times a day (BID) | ORAL | Status: DC

## 2015-03-09 NOTE — Progress Notes (Signed)
  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 We added Topamax to accelerate her weight loss, and she has noted an improvement in her irritability as well. She is not entirely compliant with her use of the medications. Overall happy with how things are going.  Past medical history, Surgical history, Family history not pertinant except as noted below, Social history, Allergies, and medications have been entered into the medical record, reviewed, and no changes needed.   Review of Systems: No fevers, chills, night sweats, weight loss, chest pain, or shortness of breath.   Objective:    General: Well Developed, well nourished, and in no acute distress.  Neuro: Alert and oriented x3, extra-ocular muscles intact, sensation grossly intact.  HEENT: Normocephalic, atraumatic, pupils equal round reactive to light, neck supple, no masses, no lymphadenopathy, thyroid nonpalpable.  Skin: Warm and dry, no rashes. Cardiac: Regular rate and rhythm, no murmurs rubs or gallops, no lower extremity edema.  Respiratory: Clear to auscultation bilaterally. Not using accessory muscles, speaking in full sentences.  Impression and Recommendations:    I spent 25 minutes with this patient, greater than 50% was face-to-face time counseling regarding the above diagnoses

## 2015-03-09 NOTE — Assessment & Plan Note (Signed)
Switch phentermine to one half tab twice a day, and increasing Topamax to 1 pill twice a day. Goal continues to be 150 pounds, she has really been only using the medicine for the past 2 weeks.  Return in one month.

## 2015-03-10 LAB — URINE CULTURE
COLONY COUNT: NO GROWTH
ORGANISM ID, BACTERIA: NO GROWTH

## 2015-04-03 ENCOUNTER — Ambulatory Visit: Payer: 59 | Admitting: Women's Health

## 2015-04-04 ENCOUNTER — Ambulatory Visit (INDEPENDENT_AMBULATORY_CARE_PROVIDER_SITE_OTHER): Payer: 59 | Admitting: Women's Health

## 2015-04-04 ENCOUNTER — Encounter: Payer: Self-pay | Admitting: Women's Health

## 2015-04-04 VITALS — BP 126/78 | Ht 67.0 in | Wt 173.0 lb

## 2015-04-04 DIAGNOSIS — Z113 Encounter for screening for infections with a predominantly sexual mode of transmission: Secondary | ICD-10-CM

## 2015-04-04 NOTE — Progress Notes (Signed)
Presents for test of cure of Chlamydia. Positive chlamydia found at annual exam/asymptomatic. Treated on 03/09/15 with Azithromycin 1g orally single dose. Abstained, ex partner informed. Denies any abdominal pain, urinary symptoms, fever, vaginal discharge or odor.  Exam: Appears well. External genitalia appear normal, no lesions noted. Scant discharge present on pelvic exam, no odor. Cervix appears normal, no lesions, erythema or inflammation noted. No discomfort during exam. GC/Chlamydia culture taken  STD screen/Test of cure Chlamydia   Plan: RPR, HIV, Hep C, Hep B, GC/Chlamydia probe pending. Continue to practice safe sexual behaviors, use a condom every time.

## 2015-04-05 LAB — HEPATITIS B SURFACE ANTIGEN: Hepatitis B Surface Ag: NEGATIVE

## 2015-04-05 LAB — HIV ANTIBODY (ROUTINE TESTING W REFLEX): HIV: NONREACTIVE

## 2015-04-05 LAB — GC/CHLAMYDIA PROBE AMP
CT PROBE, AMP APTIMA: NEGATIVE
GC Probe RNA: NEGATIVE

## 2015-04-05 LAB — RPR

## 2015-04-05 LAB — HEPATITIS C ANTIBODY: HCV AB: NEGATIVE

## 2015-04-13 ENCOUNTER — Encounter: Payer: Self-pay | Admitting: Sports Medicine

## 2015-04-13 ENCOUNTER — Ambulatory Visit (INDEPENDENT_AMBULATORY_CARE_PROVIDER_SITE_OTHER): Payer: 59 | Admitting: Sports Medicine

## 2015-04-13 DIAGNOSIS — R635 Abnormal weight gain: Secondary | ICD-10-CM

## 2015-04-13 MED ORDER — TOPIRAMATE ER 100 MG PO CAP24: 1.0000 | ORAL_CAPSULE | Freq: Every day | ORAL | Status: DC

## 2015-04-13 MED ORDER — PHENTERMINE HCL 37.5 MG PO TBDP: 1.0000 | ORAL_TABLET | Freq: Every day | ORAL | Status: DC

## 2015-04-13 NOTE — Assessment & Plan Note (Signed)
9 pound weight loss after the first one.  Refilling phentermine and switching to extended release topiramate. Goal continues to be 150 pounds. I would also like her to do a body composition test for baseline. We can repeat this in 3-6 months.

## 2015-04-13 NOTE — Progress Notes (Signed)
  Subjective:    CC: weight check  HPI: Kelly Tapia returns, she has lost 9 pounds, happy with how things are going, noncompliant with topiramate.  Past medical history, Surgical history, Family history not pertinant except as noted below, Social history, Allergies, and medications have been entered into the medical record, reviewed, and no changes needed.   Review of Systems: No fevers, chills, night sweats, weight loss, chest pain, or shortness of breath.   Objective:    General: Well Developed, well nourished, and in no acute distress.  Neuro: Alert and oriented x3, extra-ocular muscles intact, sensation grossly intact.  HEENT: Normocephalic, atraumatic, pupils equal round reactive to light, neck supple, no masses, no lymphadenopathy, thyroid nonpalpable.  Skin: Warm and dry, no rashes. Cardiac: Regular rate and rhythm, no murmurs rubs or gallops, no lower extremity edema.  Respiratory: Clear to auscultation bilaterally. Not using accessory muscles, speaking in full sentences.  Impression and Recommendations:

## 2015-05-18 ENCOUNTER — Ambulatory Visit: Payer: 59 | Admitting: Sports Medicine

## 2015-06-01 ENCOUNTER — Ambulatory Visit: Payer: 59 | Admitting: Sports Medicine

## 2015-06-12 ENCOUNTER — Ambulatory Visit: Payer: 59 | Admitting: Sports Medicine

## 2015-06-17 DIAGNOSIS — R87612 Low grade squamous intraepithelial lesion on cytologic smear of cervix (LGSIL): Secondary | ICD-10-CM

## 2015-06-17 HISTORY — DX: Low grade squamous intraepithelial lesion on cytologic smear of cervix (LGSIL): R87.612

## 2015-07-05 ENCOUNTER — Ambulatory Visit: Payer: Self-pay

## 2015-07-05 DIAGNOSIS — R635 Abnormal weight gain: Secondary | ICD-10-CM

## 2015-07-09 ENCOUNTER — Encounter: Payer: Self-pay | Admitting: Sports Medicine

## 2015-07-09 ENCOUNTER — Ambulatory Visit: Payer: 59 | Admitting: Sports Medicine

## 2015-07-09 ENCOUNTER — Ambulatory Visit (INDEPENDENT_AMBULATORY_CARE_PROVIDER_SITE_OTHER): Payer: 59 | Admitting: Sports Medicine

## 2015-07-09 VITALS — BP 121/71 | HR 80 | Temp 98.2°F | Resp 18 | Wt 162.4 lb

## 2015-07-09 DIAGNOSIS — R635 Abnormal weight gain: Secondary | ICD-10-CM

## 2015-07-09 MED ORDER — PHENTERMINE HCL 37.5 MG PO TBDP: 1.0000 | ORAL_TABLET | Freq: Every day | ORAL | Status: DC

## 2015-07-09 MED FILL — TROKENDI XR 100 MG CAPSULE: 100 | 30 days supply | Qty: 30 | Fill #2

## 2015-07-09 MED FILL — PHENTERMINE 37.5 MG TABLET: 37.5 | 30 days supply | Qty: 30 | Fill #0

## 2015-07-09 NOTE — Assessment & Plan Note (Signed)
Missed a couple of months but has overall maintained weight. We are entering the second month, return in one month.

## 2015-07-09 NOTE — Progress Notes (Signed)
  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 We are helping her with weight loss, she continues with extended release Topamax and phentermine however she missed the last 3 months. Overall her weight has remained the same.  Past medical history, Surgical history, Family history not pertinant except as noted below, Social history, Allergies, and medications have been entered into the medical record, reviewed, and no changes needed.   Review of Systems: No fevers, chills, night sweats, weight loss, chest pain, or shortness of breath.   Objective:    General: Well Developed, well nourished, and in no acute distress.  Neuro: Alert and oriented x3, extra-ocular muscles intact, sensation grossly intact.  HEENT: Normocephalic, atraumatic, pupils equal round reactive to light, neck supple, no masses, no lymphadenopathy, thyroid nonpalpable.  Skin: Warm and dry, no rashes. Cardiac: Regular rate and rhythm, no murmurs rubs or gallops, no lower extremity edema.  Respiratory: Clear to auscultation bilaterally. Not using accessory muscles, speaking in full sentences.  Impression and Recommendations:

## 2015-08-06 ENCOUNTER — Ambulatory Visit: Payer: 59 | Admitting: Sports Medicine

## 2015-08-10 ENCOUNTER — Ambulatory Visit (INDEPENDENT_AMBULATORY_CARE_PROVIDER_SITE_OTHER): Payer: 59 | Admitting: Sports Medicine

## 2015-08-10 DIAGNOSIS — R635 Abnormal weight gain: Secondary | ICD-10-CM | POA: Diagnosis not present

## 2015-08-10 MED ORDER — LIRAGLUTIDE 18 MG/3ML ~~LOC~~ SOPN: PEN_INJECTOR | SUBCUTANEOUS | Status: DC

## 2015-08-10 MED ORDER — PHENTERMINE HCL 37.5 MG PO TBDP: 1.0000 | ORAL_TABLET | Freq: Every day | ORAL | Status: DC

## 2015-08-10 MED FILL — PHENTERMINE 37.5 MG TABLET: 37.5 | 30 days supply | Qty: 30 | Fill #0

## 2015-08-10 MED FILL — UNIFINE PENTIPS 32GX5/32: 32G X 4 MM | 90 days supply | Qty: 100 | Fill #0

## 2015-08-10 MED FILL — VICTOZA 18 MG/3 ML INJECT P: 18 | 30 days supply | Qty: 9 | Fill #0

## 2015-08-10 NOTE — Progress Notes (Signed)
  Subjective:    CC: follow-up  HPI: Weight check: 0 weight loss after the second month on phentermine, has self discontinued her extended-release Topamax.  Past medical history, Surgical history, Family history not pertinant except as noted below, Social history, Allergies, and medications have been entered into the medical record, reviewed, and no changes needed.   Review of Systems: No fevers, chills, night sweats, weight loss, chest pain, or shortness of breath.   Objective:    General: Well Developed, well nourished, and in no acute distress.  Neuro: Alert and oriented x3, extra-ocular muscles intact, sensation grossly intact.  HEENT: Normocephalic, atraumatic, pupils equal round reactive to light, neck supple, no masses, no lymphadenopathy, thyroid nonpalpable.  Skin: Warm and dry, no rashes. Cardiac: Regular rate and rhythm, no murmurs rubs or gallops, no lower extremity edema.  Respiratory: Clear to auscultation bilaterally. Not using accessory muscles, speaking in full sentences.  Impression and Recommendations:

## 2015-08-10 NOTE — Assessment & Plan Note (Signed)
Continue phentermine, weight is essentially the same after 2 months. She has discontinued Topamax and feels much better and more functional. Adding Victoza.

## 2015-08-15 MED FILL — NUVARING VAGINAL RING: 0.12-0.015 | 84 days supply | Qty: 3 | Fill #1

## 2015-09-11 ENCOUNTER — Encounter: Payer: Self-pay | Admitting: Sports Medicine

## 2015-09-11 ENCOUNTER — Ambulatory Visit (INDEPENDENT_AMBULATORY_CARE_PROVIDER_SITE_OTHER): Payer: 59 | Admitting: Sports Medicine

## 2015-09-11 VITALS — BP 130/77 | HR 66 | Resp 16 | Wt 153.5 lb

## 2015-09-11 DIAGNOSIS — R635 Abnormal weight gain: Secondary | ICD-10-CM | POA: Diagnosis not present

## 2015-09-11 LAB — CBC
HCT: 39.4 % (ref 36.0–46.0)
Hemoglobin: 13.7 g/dL (ref 12.0–15.0)
MCH: 33.4 pg (ref 26.0–34.0)
MCHC: 34.8 g/dL (ref 30.0–36.0)
MCV: 96.1 fL (ref 78.0–100.0)
MPV: 10.3 fL (ref 8.6–12.4)
Platelets: 293 K/uL (ref 150–400)
RBC: 4.1 MIL/uL (ref 3.87–5.11)
RDW: 12.9 % (ref 11.5–15.5)
WBC: 6.8 K/uL (ref 4.0–10.5)

## 2015-09-11 LAB — LIPID PANEL
Cholesterol: 159 mg/dL (ref 125–200)
HDL: 67 mg/dL (ref >=46)
LDL Cholesterol: 75 mg/dL (ref <130)
Total CHOL/HDL Ratio: 2.4 ratio (ref <=5.0)
Triglycerides: 87 mg/dL (ref <150)
VLDL: 17 mg/dL (ref <30)

## 2015-09-11 LAB — TSH: TSH: 0.55 m[IU]/L

## 2015-09-11 LAB — COMPREHENSIVE METABOLIC PANEL WITH GFR
Albumin: 3.7 g/dL (ref 3.6–5.1)
CO2: 23 mmol/L (ref 20–31)
Chloride: 104 mmol/L (ref 98–110)
Creat: 1.03 mg/dL (ref 0.50–1.10)
Glucose, Bld: 68 mg/dL (ref 65–99)
Sodium: 139 mmol/L (ref 135–146)

## 2015-09-11 LAB — COMPREHENSIVE METABOLIC PANEL
ALT: 21 U/L (ref 6–29)
AST: 21 U/L (ref 10–30)
Alkaline Phosphatase: 42 U/L (ref 33–115)
BUN: 11 mg/dL (ref 7–25)
Calcium: 8.9 mg/dL (ref 8.6–10.2)
Potassium: 3.9 mmol/L (ref 3.5–5.3)
Total Bilirubin: 0.4 mg/dL (ref 0.2–1.2)
Total Protein: 6.7 g/dL (ref 6.1–8.1)

## 2015-09-11 MED ORDER — PHENTERMINE HCL 37.5 MG PO TBDP: 1.0000 | ORAL_TABLET | Freq: Every day | ORAL | Status: DC

## 2015-09-11 MED FILL — VICTOZA 18 MG/3 ML INJECT P: 18 | 30 days supply | Qty: 9 | Fill #1

## 2015-09-11 MED FILL — PHENTERMINE 37.5 MG TABLET: 37.5 | 30 days supply | Qty: 30 | Fill #0

## 2015-09-11 NOTE — Assessment & Plan Note (Addendum)
Has now lost weight, 9 pounds after starting Victoza. Entering the fourth month Refilling phentermine, doing better off of Topamax Return in one month.

## 2015-09-11 NOTE — Progress Notes (Signed)
  Subjective:    CC: weight check  HPI: Braden plateaued in her weight loss, we had Victoza at the last visit and she is lost an additional 9 pounds, has some difficulty with exercise tolerance on Victoza but overall is happy with results. She also feels significantly better since coming off of her Topamax, needs a refill on phentermine.  Preventative measures: Needs routine screening for [facility] metrics.  Past medical history, Surgical history, Family history not pertinant except as noted below, Social history, Allergies, and medications have been entered into the medical record, reviewed, and no changes needed.   Review of Systems: No fevers, chills, night sweats, weight loss, chest pain, or shortness of breath.   Objective:    General: Well Developed, well nourished, and in no acute distress.  Neuro: Alert and oriented x3, extra-ocular muscles intact, sensation grossly intact.  HEENT: Normocephalic, atraumatic, pupils equal round reactive to light, neck supple, no masses, no lymphadenopathy, thyroid nonpalpable.  Skin: Warm and dry, no rashes. Cardiac: Regular rate and rhythm, no murmurs rubs or gallops, no lower extremity edema.  Respiratory: Clear to auscultation bilaterally. Not using accessory muscles, speaking in full sentences.  Impression and Recommendations:

## 2015-09-12 LAB — HEMOGLOBIN A1C
Hgb A1c MFr Bld: 5.1 % (ref <5.7)
Mean Plasma Glucose: 100 mg/dL

## 2015-09-12 LAB — VITAMIN D 25 HYDROXY (VIT D DEFICIENCY, FRACTURES): Vit D, 25-Hydroxy: 68 ng/mL (ref 30–100)

## 2015-10-09 ENCOUNTER — Encounter: Payer: Self-pay | Admitting: Sports Medicine

## 2015-10-09 ENCOUNTER — Ambulatory Visit (INDEPENDENT_AMBULATORY_CARE_PROVIDER_SITE_OTHER): Payer: 59 | Admitting: Sports Medicine

## 2015-10-09 VITALS — BP 118/77 | HR 82 | Resp 18 | Wt 146.1 lb

## 2015-10-09 DIAGNOSIS — R635 Abnormal weight gain: Secondary | ICD-10-CM | POA: Diagnosis not present

## 2015-10-09 MED ORDER — PHENTERMINE HCL 37.5 MG PO TBDP: 1.0000 | ORAL_TABLET | Freq: Every day | ORAL | Status: DC

## 2015-10-09 MED ORDER — LIRAGLUTIDE 18 MG/3ML ~~LOC~~ SOPN: PEN_INJECTOR | SUBCUTANEOUS | Status: DC

## 2015-10-09 MED FILL — VICTOZA 18 MG/3 ML INJECT P: 18 | 30 days supply | Qty: 9 | Fill #0

## 2015-10-09 NOTE — Assessment & Plan Note (Signed)
Additional 7 pound weight loss as we enter the fifth month, refilling phentermine, Victoza. We did discuss keeping her on half tab phentermine at the six-month point for a prolonged period of time. She is back in the gym, and fitting into her dresses better.

## 2015-10-09 NOTE — Progress Notes (Signed)
  Subjective:    CC: weight check  HPI: This pleasant 35 year old female continues to do well on weight loss medications, she is on phentermine, Victoza. She has lost an additional 6 pounds since last month.  Past medical history, Surgical history, Family history not pertinant except as noted below, Social history, Allergies, and medications have been entered into the medical record, reviewed, and no changes needed.   Review of Systems: No fevers, chills, night sweats, weight loss, chest pain, or shortness of breath.   Objective:    General: Well Developed, well nourished, and in no acute distress.  Neuro: Alert and oriented x3, extra-ocular muscles intact, sensation grossly intact.  HEENT: Normocephalic, atraumatic, pupils equal round reactive to light, neck supple, no masses, no lymphadenopathy, thyroid nonpalpable.  Skin: Warm and dry, no rashes. Cardiac: Regular rate and rhythm, no murmurs rubs or gallops, no lower extremity edema.  Respiratory: Clear to auscultation bilaterally. Not using accessory muscles, speaking in full sentences.  Impression and Recommendations:

## 2015-10-11 MED FILL — PHENTERMINE 37.5 MG TABLET: 37.5 | 30 days supply | Qty: 30 | Fill #0

## 2015-11-07 ENCOUNTER — Ambulatory Visit: Payer: 59 | Admitting: Sports Medicine

## 2015-11-09 MED FILL — NUVARING VAGINAL RING: 0.12-0.015 | 84 days supply | Qty: 3 | Fill #2

## 2015-11-15 ENCOUNTER — Ambulatory Visit: Payer: 59 | Admitting: Sports Medicine

## 2016-01-11 ENCOUNTER — Encounter: Payer: Self-pay | Admitting: Sports Medicine

## 2016-01-11 ENCOUNTER — Ambulatory Visit (INDEPENDENT_AMBULATORY_CARE_PROVIDER_SITE_OTHER): Payer: 59 | Admitting: Sports Medicine

## 2016-01-11 DIAGNOSIS — M7541 Impingement syndrome of right shoulder: Secondary | ICD-10-CM | POA: Insufficient documentation

## 2016-01-11 DIAGNOSIS — M7711 Lateral epicondylitis, right elbow: Secondary | ICD-10-CM

## 2016-01-11 DIAGNOSIS — M75101 Unspecified rotator cuff tear or rupture of right shoulder, not specified as traumatic: Secondary | ICD-10-CM

## 2016-01-11 DIAGNOSIS — R635 Abnormal weight gain: Secondary | ICD-10-CM | POA: Diagnosis not present

## 2016-01-11 MED ORDER — MELOXICAM 15 MG PO TABS: ORAL_TABLET | ORAL | 3 refills | Status: DC

## 2016-01-11 MED ORDER — PHENTERMINE HCL 37.5 MG PO TBDP: 1.0000 | ORAL_TABLET | Freq: Every day | ORAL | 0 refills | Status: DC

## 2016-01-11 MED ORDER — LIRAGLUTIDE 18 MG/3ML ~~LOC~~ SOPN: PEN_INJECTOR | SUBCUTANEOUS | 3 refills | Status: DC

## 2016-01-11 MED FILL — UNIFINE PENTIPS 32GX5/32: 32G X 4 MM | 90 days supply | Qty: 100 | Fill #1

## 2016-01-11 MED FILL — VICTOZA 18 MG/3 ML INJECT P: 18 | 30 days supply | Qty: 9 | Fill #0

## 2016-01-11 MED FILL — MELOXICAM 15 MG TABLET: 15 | 30 days supply | Qty: 30 | Fill #0

## 2016-01-11 NOTE — Progress Notes (Signed)
  Subjective:    CC: Follow-up  HPI: Abnormal weight gain: Has been off of phentermine and Victoza for 3 months now, and has not surprisingly gained some weight.  Right shoulder pain: Localized over the deltoid, worse with overhead activities after playing football with her family.  Right elbow pain: Also after football, localized over the common extensor tendon origin.  Past medical history, Surgical history, Family history not pertinant except as noted below, Social history, Allergies, and medications have been entered into the medical record, reviewed, and no changes needed.   Review of Systems: No fevers, chills, night sweats, weight loss, chest pain, or shortness of breath.   Objective:    General: Well Developed, well nourished, and in no acute distress.  Neuro: Alert and oriented x3, extra-ocular muscles intact, sensation grossly intact.  HEENT: Normocephalic, atraumatic, pupils equal round reactive to light, neck supple, no masses, no lymphadenopathy, thyroid nonpalpable.  Skin: Warm and dry, no rashes. Cardiac: Regular rate and rhythm, no murmurs rubs or gallops, no lower extremity edema.  Respiratory: Clear to auscultation bilaterally. Not using accessory muscles, speaking in full sentences. Right Shoulder: Inspection reveals no abnormalities, atrophy or asymmetry. Palpation is normal with no tenderness over AC joint or bicipital groove. ROM is full in all planes. Rotator cuff strength normal throughout. Positive Neer and Hawkin's tests, empty can. Speeds and Yergason's tests normal. No labral pathology noted with negative Obrien's, negative crank, negative clunk, and good stability. Normal scapular function observed. No painful arc and no drop arm sign. No apprehension sign Right Elbow: Unremarkable to inspection. Range of motion full pronation, supination, flexion, extension. Strength is full to all of the above directions Stable to varus, valgus stress. Negative  moving valgus stress test. Tender to palpation at the common extensor tendon origin Ulnar nerve does not sublux. Negative cubital tunnel Tinel's.  Impression and Recommendations:    Abnormal weight gain Has been off of medication, refilling phentermine, Victoza. We will do phentermine for an additional 2 months, and then likely switch to Contrave.  Right tennis elbow Meloxicam, rehabilitation exercises given.  Rotator cuff impingement syndrome of right shoulder Meloxicam, rehabilitation exercises, theraband. Return in one month, injection if no better.  I spent 25 minutes with this patient, greater than 50% was face-to-face time counseling regarding the above diagnoses

## 2016-01-11 NOTE — Assessment & Plan Note (Signed)
Meloxicam, rehabilitation exercises given.

## 2016-01-11 NOTE — Assessment & Plan Note (Signed)
Has been off of medication, refilling phentermine, Victoza. We will do phentermine for an additional 2 months, and then likely switch to Contrave.

## 2016-01-11 NOTE — Assessment & Plan Note (Signed)
Meloxicam, rehabilitation exercises, theraband. Return in one month, injection if no better.

## 2016-01-17 MED FILL — PHENTERMINE 37.5 MG TABLET: 37.5 | 30 days supply | Qty: 30 | Fill #0

## 2016-02-08 ENCOUNTER — Ambulatory Visit: Payer: 59 | Admitting: Sports Medicine

## 2016-02-14 ENCOUNTER — Ambulatory Visit (INDEPENDENT_AMBULATORY_CARE_PROVIDER_SITE_OTHER): Payer: 59

## 2016-02-14 ENCOUNTER — Ambulatory Visit (INDEPENDENT_AMBULATORY_CARE_PROVIDER_SITE_OTHER): Payer: 59 | Admitting: Sports Medicine

## 2016-02-14 DIAGNOSIS — M75101 Unspecified rotator cuff tear or rupture of right shoulder, not specified as traumatic: Secondary | ICD-10-CM | POA: Diagnosis not present

## 2016-02-14 DIAGNOSIS — M7711 Lateral epicondylitis, right elbow: Secondary | ICD-10-CM

## 2016-02-14 DIAGNOSIS — M7541 Impingement syndrome of right shoulder: Secondary | ICD-10-CM

## 2016-02-14 DIAGNOSIS — M25521 Pain in right elbow: Secondary | ICD-10-CM | POA: Diagnosis not present

## 2016-02-14 DIAGNOSIS — R635 Abnormal weight gain: Secondary | ICD-10-CM | POA: Diagnosis not present

## 2016-02-14 MED ORDER — PHENTERMINE HCL 37.5 MG PO TBDP: 1.0000 | ORAL_TABLET | Freq: Every day | ORAL | 0 refills | Status: DC

## 2016-02-14 MED ORDER — NALTREXONE HCL 50 MG PO TABS: ORAL_TABLET | ORAL | 3 refills | Status: DC

## 2016-02-14 MED ORDER — BUPROPION HCL ER (XL) 150 MG PO TB24: ORAL_TABLET | ORAL | 3 refills | Status: DC

## 2016-02-14 MED FILL — NUVARING VAGINAL RING: 0.12-0.015 | 84 days supply | Qty: 3 | Fill #3

## 2016-02-14 MED FILL — BUPROPION HCL XL 150 MG TAB: 150 | 30 days supply | Qty: 30 | Fill #0

## 2016-02-14 MED FILL — NALTREXONE 50 MG TABLET: 50 | 30 days supply | Qty: 30 | Fill #0

## 2016-02-14 NOTE — Assessment & Plan Note (Signed)
Refilling phentermine for seeing additional month, we will down taper after that. Continue Victoza. Adding naltrexone and Wellbutrin in a Contrave dosing schedule. Contrave will not be approved considering her BMI. Return in one month.

## 2016-02-14 NOTE — Assessment & Plan Note (Signed)
Getting better. She will avoid certain provocative activities in the gym and only do cable flies as well as equine press

## 2016-02-14 NOTE — Progress Notes (Signed)
  Subjective:    CC: Follow-up  HPI: Obesity: Good continued weight loss. Agreeable to add Wellbutrin and naltrexone, continues with Victoza.  Right shoulder pain: Rotator cuff, getting better, she has really not been that diligent with the exercises. Most of her pain comes with incline press, straight chest press, dumbbell chest press, and butterflies.  Right elbow pain: Localized somewhat posterior lateral but no longer directly over the lateral epicondyles.  Not really painful today.  Past medical history, Surgical history, Family history not pertinant except as noted below, Social history, Allergies, and medications have been entered into the medical record, reviewed, and no changes needed.   Review of Systems: No fevers, chills, night sweats, weight loss, chest pain, or shortness of breath.   Objective:    General: Well Developed, well nourished, and in no acute distress.  Neuro: Alert and oriented x3, extra-ocular muscles intact, sensation grossly intact.  HEENT: Normocephalic, atraumatic, pupils equal round reactive to light, neck supple, no masses, no lymphadenopathy, thyroid nonpalpable.  Skin: Warm and dry, no rashes. Cardiac: Regular rate and rhythm, no murmurs rubs or gallops, no lower extremity edema.  Respiratory: Clear to auscultation bilaterally. Not using accessory muscles, speaking in full sentences. Right Elbow: Unremarkable to inspection. Range of motion full pronation, supination, flexion, extension. Strength is full to all of the above directions, resisted extension of the wrist does produce some pain. Stable to varus, valgus stress. Negative moving valgus stress test. No discrete areas of tenderness to palpation. Ulnar nerve does not sublux. Negative cubital tunnel Tinel's.  Impression and Recommendations:    Abnormal weight gain Refilling phentermine for seeing additional month, we will down taper after that. Continue Victoza. Adding naltrexone and  Wellbutrin in a Contrave dosing schedule. Contrave will not be approved considering her BMI. Return in one month.  Rotator cuff impingement syndrome of right shoulder Getting better. She will avoid certain provocative activities in the gym and only do cable flies as well as equine press  Right tennis elbow Unclear of the pain generator, is not classic tennis elbow right now. X-rays, she will see me when her pain is flaring so that we can better determine the pain generator. I do suspect we will need an MRI.  I spent 25 minutes with this patient, greater than 50% was face-to-face time counseling regarding the above diagnoses

## 2016-02-14 NOTE — Assessment & Plan Note (Signed)
Unclear of the pain generator, is not classic tennis elbow right now. X-rays, she will see me when her pain is flaring so that we can better determine the pain generator. I do suspect we will need an MRI.

## 2016-02-27 ENCOUNTER — Encounter: Payer: Self-pay | Admitting: Women's Health

## 2016-02-27 ENCOUNTER — Ambulatory Visit (INDEPENDENT_AMBULATORY_CARE_PROVIDER_SITE_OTHER): Payer: 59 | Admitting: Women's Health

## 2016-02-27 VITALS — BP 124/80 | Ht 67.0 in | Wt 152.0 lb

## 2016-02-27 DIAGNOSIS — R350 Frequency of micturition: Secondary | ICD-10-CM | POA: Diagnosis not present

## 2016-02-27 DIAGNOSIS — Z113 Encounter for screening for infections with a predominantly sexual mode of transmission: Secondary | ICD-10-CM | POA: Diagnosis not present

## 2016-02-27 DIAGNOSIS — N898 Other specified noninflammatory disorders of vagina: Secondary | ICD-10-CM | POA: Diagnosis not present

## 2016-02-27 DIAGNOSIS — N3000 Acute cystitis without hematuria: Secondary | ICD-10-CM

## 2016-02-27 LAB — URINALYSIS W MICROSCOPIC + REFLEX CULTURE
Bilirubin Urine: NEGATIVE
Casts: NONE SEEN [LPF]
Crystals: NONE SEEN [HPF]
Glucose, UA: NEGATIVE
Ketones, ur: NEGATIVE
Nitrite: NEGATIVE
Protein, ur: NEGATIVE
Specific Gravity, Urine: 1.01 (ref 1.001–1.035)
Yeast: NONE SEEN [HPF]
pH: 7 (ref 5.0–8.0)

## 2016-02-27 LAB — WET PREP FOR TRICH, YEAST, CLUE
Clue Cells Wet Prep HPF POC: NONE SEEN
Trich, Wet Prep: NONE SEEN
Yeast Wet Prep HPF POC: NONE SEEN

## 2016-02-27 MED ORDER — SULFAMETHOXAZOLE-TRIMETHOPRIM 800-160 MG PO TABS: 1.0000 | ORAL_TABLET | Freq: Two times a day (BID) | ORAL | 0 refills | Status: DC

## 2016-02-27 MED ORDER — FLUCONAZOLE 150 MG PO TABS: 150.0000 mg | ORAL_TABLET | Freq: Once | ORAL | 1 refills | Status: AC

## 2016-02-27 MED FILL — FLUCONAZOLE 150 MG TABLET: 150 | 1 days supply | Qty: 1 | Fill #0

## 2016-02-27 MED FILL — PHENTERMINE 37.5 MG TABLET: 37.5 | 30 days supply | Qty: 30 | Fill #0

## 2016-02-27 MED FILL — SULFAMETHOXAZOLE/TMP DS TAB: 800-160 | 3 days supply | Qty: 6 | Fill #0

## 2016-02-27 NOTE — Progress Notes (Signed)
Presents with complaint of creased urinary frequency, pain at end of stream of urination, low abdominal pressure, minimal discharge. Denies fever. New partner, monthly cycle on NuvaRing.  Exam: Appears well. Abdomen soft nontender, external genitalia within normal limits, speculum exam scant white discharge without erythema, wet prep negative. GC/Chlamydia culture taken. UA: +1 blood, +2 leukocytes, 20-40 WBCs, moderate bacteria, 3-10 RBCs,  UTI STD screen  Plan: Septra twice daily for 3 days #6 prescription, proper use given and reviewed. Diflucan 150 by mouth 1 dose per request if needed.  Urine culture pending. UTI prevention discussed. Instructed to call if no relief of symptoms. GC/Chlamydia culture pending, HIV, hep B, C, RPR.

## 2016-02-27 NOTE — Patient Instructions (Signed)

## 2016-02-28 LAB — GC/CHLAMYDIA PROBE AMP
CT Probe RNA: NOT DETECTED
GC PROBE AMP APTIMA: NOT DETECTED

## 2016-02-28 LAB — RPR

## 2016-02-28 LAB — HEPATITIS B SURFACE ANTIGEN: Hepatitis B Surface Ag: NEGATIVE

## 2016-02-28 LAB — HEPATITIS C ANTIBODY: HCV Ab: NEGATIVE

## 2016-02-28 LAB — HIV ANTIBODY (ROUTINE TESTING W REFLEX): HIV 1&2 Ab, 4th Generation: NONREACTIVE

## 2016-02-29 LAB — URINE CULTURE

## 2016-03-03 MED FILL — VICTOZA 18 MG/3 ML INJECT P: 18 | 30 days supply | Qty: 9 | Fill #1

## 2016-03-19 ENCOUNTER — Encounter: Payer: 59 | Admitting: Women's Health

## 2016-03-24 ENCOUNTER — Ambulatory Visit: Payer: 59 | Admitting: Sports Medicine

## 2016-03-25 ENCOUNTER — Ambulatory Visit (INDEPENDENT_AMBULATORY_CARE_PROVIDER_SITE_OTHER): Payer: 59 | Admitting: Women's Health

## 2016-03-25 ENCOUNTER — Encounter: Payer: Self-pay | Admitting: Women's Health

## 2016-03-25 VITALS — BP 118/78 | Ht 67.0 in | Wt 160.0 lb

## 2016-03-25 DIAGNOSIS — Z833 Family history of diabetes mellitus: Secondary | ICD-10-CM | POA: Diagnosis not present

## 2016-03-25 DIAGNOSIS — Z3049 Encounter for surveillance of other contraceptives: Secondary | ICD-10-CM

## 2016-03-25 DIAGNOSIS — Z13220 Encounter for screening for lipoid disorders: Secondary | ICD-10-CM | POA: Diagnosis not present

## 2016-03-25 DIAGNOSIS — R87612 Low grade squamous intraepithelial lesion on cytologic smear of cervix (LGSIL): Secondary | ICD-10-CM | POA: Diagnosis not present

## 2016-03-25 DIAGNOSIS — Z01419 Encounter for gynecological examination (general) (routine) without abnormal findings: Secondary | ICD-10-CM

## 2016-03-25 LAB — CBC WITH DIFFERENTIAL/PLATELET
BASOS ABS: 0 {cells}/uL (ref 0–200)
BASOS PCT: 0 %
EOS ABS: 99 {cells}/uL (ref 15–500)
Eosinophils Relative: 1 %
HEMATOCRIT: 37.1 % (ref 35.0–45.0)
Hemoglobin: 12.4 g/dL (ref 11.7–15.5)
LYMPHS PCT: 31 %
Lymphs Abs: 3069 cells/uL (ref 850–3900)
MCH: 31.7 pg (ref 27.0–33.0)
MCHC: 33.4 g/dL (ref 32.0–36.0)
MCV: 94.9 fL (ref 80.0–100.0)
MONO ABS: 792 {cells}/uL (ref 200–950)
MPV: 10.3 fL (ref 7.5–12.5)
Monocytes Relative: 8 %
Neutro Abs: 5940 cells/uL (ref 1500–7800)
Neutrophils Relative %: 60 %
PLATELETS: 322 10*3/uL (ref 140–400)
RBC: 3.91 MIL/uL (ref 3.80–5.10)
RDW: 13.6 % (ref 11.0–15.0)
WBC: 9.9 10*3/uL (ref 3.8–10.8)

## 2016-03-25 MED ORDER — NUVARING 0.12-0.015 MG/24HR VA RING: VAGINAL_RING | VAGINAL | 4 refills | Status: DC

## 2016-03-25 NOTE — Progress Notes (Signed)
Kelly Tapia [birth year] [id number]    History:    Presents for annual exam.  Monthly cycle on nuva ring. 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  Received 2 gardasils. Weight down 40 pounds, up 8 pounds from last year. Desires no children.  Past medical history, past surgical history, family history and social history were all reviewed and documented in the EPIC chart. Glass blower/designer for [employer].   ROS:  A ROS was performed and pertinent positives and negatives are included.  Exam:  Vitals:   03/25/33 1504  BP: 118/78  Weight: 160 lb (72.6 kg)  Height: 5\' 7"  (1.702 m)   Body mass index is 25.06 kg/m.   General appearance:  Normal Thyroid:  Symmetrical, normal in size, without palpable masses or nodularity. Respiratory  Auscultation:  Clear without wheezing or rhonchi Cardiovascular  Auscultation:  Regular rate, without rubs, murmurs or gallops  Edema/varicosities:  Not grossly evident Abdominal  Soft,nontender, without masses, guarding or rebound.  Liver/spleen:  No organomegaly noted  Hernia:  None appreciated  Skin  Inspection:  Grossly normal   Breasts: Examined lying and sitting.     Right: Without masses, retractions, discharge or axillary adenopathy.     Left: Without masses, retractions, discharge or axillary adenopathy. Gentitourinary   Inguinal/mons:  Normal without inguinal adenopathy  External genitalia:  Normal  BUS/Urethra/Skene's glands:  Normal  Vagina:  Normal  Cervix:  Normal  Uterus:   normal in size, shape and contour.  Midline and mobile  Adnexa/parametria:     Rt: Without masses or tenderness.   Lt: Without masses or tenderness.  Anus and perineum: Normal  Digital rectal exam: Normal sphincter tone without palpated masses or tenderness  Assessment/Plan:  35 y.o. D WF G0 for annual exam with no complaints.  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 Anxiety/depression-primary care manages  Plan: Nuva ring prescription, proper  use, slight risk for blood clots and strokes reviewed. Condoms encouraged until permanent partner. SBE's, continue active lifestyle and regular exercise, calcium rich diet, MVI daily encouraged. CBC, glucose, lipid panel, UA, Pap with HR HPV typing, new screening guidelines reviewed.    Huel Cote [facility], 3:23 PM 03/25/2016

## 2016-03-25 NOTE — Patient Instructions (Signed)
Health Maintenance, Female Adopting a healthy lifestyle and getting preventive care can go a long way to promote health and wellness. Talk with your health care provider about what schedule of regular examinations is right for you. This is a good chance for you to check in with your provider about disease prevention and staying healthy. In between checkups, there are plenty of things you can do on your own. Experts have done a lot of research about which lifestyle changes and preventive measures are most likely to keep you healthy. Ask your health care provider for more information. WEIGHT AND DIET  Eat a healthy diet  Be sure to include plenty of vegetables, fruits, low-fat dairy products, and lean protein.  Do not eat a lot of foods high in solid fats, added sugars, or salt.  Get regular exercise. This is one of the most important things you can do for your health.  Most adults should exercise for at least 150 minutes each week. The exercise should increase your heart rate and make you sweat (moderate-intensity exercise).  Most adults should also do strengthening exercises at least twice a week. This is in addition to the moderate-intensity exercise.  Maintain a healthy weight  Body mass index (BMI) is a measurement that can be used to identify possible weight problems. It estimates body fat based on height and weight. Your health care provider can help determine your BMI and help you achieve or maintain a healthy weight.  For females 20 years of age and older:   A BMI below 18.5 is considered underweight.  A BMI of 18.5 to 24.9 is normal.  A BMI of 25 to 29.9 is considered overweight.  A BMI of 30 and above is considered obese.  Watch levels of cholesterol and blood lipids  You should start having your blood tested for lipids and cholesterol at 34 years of age, then have this test every 5 years.  You may need to have your cholesterol levels checked more often if:  Your lipid  or cholesterol levels are high.  You are older than 34 years of age.  You are at high risk for heart disease.  CANCER SCREENING   Lung Cancer  Lung cancer screening is recommended for adults 55-80 years old who are at high risk for lung cancer because of a history of smoking.  A yearly low-dose CT scan of the lungs is recommended for people who:  Currently smoke.  Have quit within the past 15 years.  Have at least a 30-pack-year history of smoking. A pack year is smoking an average of one pack of cigarettes a day for 1 year.  Yearly screening should continue until it has been 15 years since you quit.  Yearly screening should stop if you develop a health problem that would prevent you from having lung cancer treatment.  Breast Cancer  Practice breast self-awareness. This means understanding how your breasts normally appear and feel.  It also means doing regular breast self-exams. Let your health care provider know about any changes, no matter how small.  If you are in your 20s or 30s, you should have a clinical breast exam (CBE) by a health care provider every 1-3 years as part of a regular health exam.  If you are 40 or older, have a CBE every year. Also consider having a breast X-ray (mammogram) every year.  If you have a family history of breast cancer, talk to your health care provider about genetic screening.  If you   are at high risk for breast cancer, talk to your health care provider about having an MRI and a mammogram every year.  Breast cancer gene (BRCA) assessment is recommended for women who have family members with BRCA-related cancers. BRCA-related cancers include:  Breast.  Ovarian.  Tubal.  Peritoneal cancers.  Results of the assessment will determine the need for genetic counseling and BRCA1 and BRCA2 testing. Cervical Cancer Your health care provider may recommend that you be screened regularly for cancer of the pelvic organs (ovaries, uterus, and  vagina). This screening involves a pelvic examination, including checking for microscopic changes to the surface of your cervix (Pap test). You may be encouraged to have this screening done every 3 years, beginning at age 21.  For women ages 30-65, health care providers may recommend pelvic exams and Pap testing every 3 years, or they may recommend the Pap and pelvic exam, combined with testing for human papilloma virus (HPV), every 5 years. Some types of HPV increase your risk of cervical cancer. Testing for HPV may also be done on women of any age with unclear Pap test results.  Other health care providers may not recommend any screening for nonpregnant women who are considered low risk for pelvic cancer and who do not have symptoms. Ask your health care provider if a screening pelvic exam is right for you.  If you have had past treatment for cervical cancer or a condition that could lead to cancer, you need Pap tests and screening for cancer for at least 20 years after your treatment. If Pap tests have been discontinued, your risk factors (such as having a new sexual partner) need to be reassessed to determine if screening should resume. Some women have medical problems that increase the chance of getting cervical cancer. In these cases, your health care provider may recommend more frequent screening and Pap tests. Colorectal Cancer  This type of cancer can be detected and often prevented.  Routine colorectal cancer screening usually begins at 34 years of age and continues through 34 years of age.  Your health care provider may recommend screening at an earlier age if you have risk factors for colon cancer.  Your health care provider may also recommend using home test kits to check for hidden blood in the stool.  A small camera at the end of a tube can be used to examine your colon directly (sigmoidoscopy or colonoscopy). This is done to check for the earliest forms of colorectal  cancer.  Routine screening usually begins at age 50.  Direct examination of the colon should be repeated every 5-10 years through 34 years of age. However, you may need to be screened more often if early forms of precancerous polyps or small growths are found. Skin Cancer  Check your skin from head to toe regularly.  Tell your health care provider about any new moles or changes in moles, especially if there is a change in a mole's shape or color.  Also tell your health care provider if you have a mole that is larger than the size of a pencil eraser.  Always use sunscreen. Apply sunscreen liberally and repeatedly throughout the day.  Protect yourself by wearing long sleeves, pants, a wide-brimmed hat, and sunglasses whenever you are outside. HEART DISEASE, DIABETES, AND HIGH BLOOD PRESSURE   High blood pressure causes heart disease and increases the risk of stroke. High blood pressure is more likely to develop in:  People who have blood pressure in the high end   of the normal range (130-139/85-89 mm Hg).  People who are overweight or obese.  People who are African American.  If you are 38-23 years of age, have your blood pressure checked every 3-5 years. If you are 61 years of age or older, have your blood pressure checked every year. You should have your blood pressure measured twice--once when you are at a hospital or clinic, and once when you are not at a hospital or clinic. Record the average of the two measurements. To check your blood pressure when you are not at a hospital or clinic, you can use:  An automated blood pressure machine at a pharmacy.  A home blood pressure monitor.  If you are between 45 years and 39 years old, ask your health care provider if you should take aspirin to prevent strokes.  Have regular diabetes screenings. This involves taking a blood sample to check your fasting blood sugar level.  If you are at a normal weight and have a low risk for diabetes,  have this test once every three years after 34 years of age.  If you are overweight and have a high risk for diabetes, consider being tested at a younger age or more often. PREVENTING INFECTION  Hepatitis B  If you have a higher risk for hepatitis B, you should be screened for this virus. You are considered at high risk for hepatitis B if:  You were born in a country where hepatitis B is common. Ask your health care provider which countries are considered high risk.  Your parents were born in a high-risk country, and you have not been immunized against hepatitis B (hepatitis B vaccine).  You have HIV or AIDS.  You use needles to inject street drugs.  You live with someone who has hepatitis B.  You have had sex with someone who has hepatitis B.  You get hemodialysis treatment.  You take certain medicines for conditions, including cancer, organ transplantation, and autoimmune conditions. Hepatitis C  Blood testing is recommended for:  Everyone born from [birth year] through 1964.  Anyone with known risk factors for hepatitis C. Sexually transmitted infections (STIs)  You should be screened for sexually transmitted infections (STIs) including gonorrhea and chlamydia if:  You are sexually active and are younger than 34 years of age.  You are older than 34 years of age and your health care provider tells you that you are at risk for this type of infection.  Your sexual activity has changed since you were last screened and you are at an increased risk for chlamydia or gonorrhea. Ask your health care provider if you are at risk.  If you do not have HIV, but are at risk, it may be recommended that you take a prescription medicine daily to prevent HIV infection. This is called pre-exposure prophylaxis (PrEP). You are considered at risk if:  You are sexually active and do not regularly use condoms or know the HIV status of your partner(s).  You take drugs by injection.  You are sexually  active with a partner who has HIV. Talk with your health care provider about whether you are at high risk of being infected with HIV. If you choose to begin PrEP, you should first be tested for HIV. You should then be tested every 3 months for as long as you are taking PrEP.  PREGNANCY   If you are premenopausal and you may become pregnant, ask your health care provider about preconception counseling.  If you may  become pregnant, take 400 to 800 micrograms (mcg) of folic acid every day.  If you want to prevent pregnancy, talk to your health care provider about birth control (contraception). OSTEOPOROSIS AND MENOPAUSE   Osteoporosis is a disease in which the bones lose minerals and strength with aging. This can result in serious bone fractures. Your risk for osteoporosis can be identified using a bone density scan.  If you are 72 years of age or older, or if you are at risk for osteoporosis and fractures, ask your health care provider if you should be screened.  Ask your health care provider whether you should take a calcium or vitamin D supplement to lower your risk for osteoporosis.  Menopause may have certain physical symptoms and risks.  Hormone replacement therapy may reduce some of these symptoms and risks. Talk to your health care provider about whether hormone replacement therapy is right for you.  HOME CARE INSTRUCTIONS   Schedule regular health, dental, and eye exams.  Stay current with your immunizations.   Do not use any tobacco products including cigarettes, chewing tobacco, or electronic cigarettes.  If you are pregnant, do not drink alcohol.  If you are breastfeeding, limit how much and how often you drink alcohol.  Limit alcohol intake to no more than 1 drink per day for nonpregnant women. One drink equals 12 ounces of beer, 5 ounces of wine, or 1 ounces of hard liquor.  Do not use street drugs.  Do not share needles.  Ask your health care provider for help if  you need support or information about quitting drugs.  Tell your health care provider if you often feel depressed.  Tell your health care provider if you have ever been abused or do not feel safe at home.   This information is not intended to replace advice given to you by your health care provider. Make sure you discuss any questions you have with your health care provider.   Document Released: 12/16/2010 Document Revised: 06/23/2014 Document Reviewed: 05/04/2013 Elsevier Interactive Patient Education [employer].

## 2016-03-26 ENCOUNTER — Encounter: Payer: Self-pay | Admitting: Women's Health

## 2016-03-26 LAB — VITAMIN D 25 HYDROXY (VIT D DEFICIENCY, FRACTURES): VIT D 25 HYDROXY: 50 ng/mL (ref 30–100)

## 2016-03-26 LAB — URINALYSIS W MICROSCOPIC + REFLEX CULTURE
BACTERIA UA: NONE SEEN [HPF]
BILIRUBIN URINE: NEGATIVE
CASTS: NONE SEEN [LPF]
CRYSTALS: NONE SEEN [HPF]
Glucose, UA: NEGATIVE
Hgb urine dipstick: NEGATIVE
Ketones, ur: NEGATIVE
Leukocytes, UA: NEGATIVE
Nitrite: NEGATIVE
PROTEIN: NEGATIVE
RBC / HPF: NONE SEEN RBC/HPF (ref <=2)
Specific Gravity, Urine: 1.007 (ref 1.001–1.035)
Squamous Epithelial / LPF: NONE SEEN [HPF] (ref <=5)
WBC UA: NONE SEEN WBC/HPF (ref <=5)
Yeast: NONE SEEN [HPF]
pH: 7 (ref 5.0–8.0)

## 2016-03-26 LAB — LIPID PANEL
CHOL/HDL RATIO: 2.6 ratio (ref <=5.0)
Cholesterol: 212 mg/dL — ABNORMAL HIGH (ref 125–200)
HDL: 83 mg/dL (ref >=46)
LDL CALC: 114 mg/dL (ref <130)
TRIGLYCERIDES: 74 mg/dL (ref <150)
VLDL: 15 mg/dL (ref <30)

## 2016-03-26 LAB — GLUCOSE, RANDOM: Glucose, Bld: 90 mg/dL (ref 65–99)

## 2016-03-27 LAB — PAP, TP IMAGING W/ HPV RNA, RFLX HPV TYPE 16,18/45: HPV mRNA, High Risk: DETECTED — AB

## 2016-04-01 ENCOUNTER — Encounter: Payer: Self-pay | Admitting: Sports Medicine

## 2016-04-01 ENCOUNTER — Ambulatory Visit (INDEPENDENT_AMBULATORY_CARE_PROVIDER_SITE_OTHER): Payer: 59 | Admitting: Sports Medicine

## 2016-04-01 DIAGNOSIS — L03213 Periorbital cellulitis: Secondary | ICD-10-CM

## 2016-04-01 MED ORDER — DOXYCYCLINE HYCLATE 100 MG PO TABS: 100.0000 mg | ORAL_TABLET | Freq: Two times a day (BID) | ORAL | 0 refills | Status: AC

## 2016-04-01 MED ORDER — PREDNISONE 50 MG PO TABS: ORAL_TABLET | ORAL | 0 refills | Status: DC

## 2016-04-01 MED FILL — DOXYCYCLINE HYCLATE 100 MG: 100 | 7 days supply | Qty: 14 | Fill #0

## 2016-04-01 MED FILL — predniSONE 50 MG TABS: 50 | 5 days supply | Qty: 5 | Fill #0

## 2016-04-01 NOTE — Progress Notes (Signed)
  Subjective:    CC: Left eye swelling  HPI: For the past several days this pleasant 35 year old female has had increasing swelling around her left eye, without changes in her vision, swelling is somewhat painful, she also has some upper respiratory symptoms, moderate, persistent. No headaches, no pain with extraocular movement. No constitutional symptoms.  Past medical history:  Negative.  See flowsheet/record as well for more information.  Surgical history: Negative.  See flowsheet/record as well for more information.  Family history: Negative.  See flowsheet/record as well for more information.  Social history: Negative.  See flowsheet/record as well for more information.  Allergies, and medications have been entered into the medical record, reviewed, and no changes needed.   Review of Systems: No fevers, chills, night sweats, weight loss, chest pain, or shortness of breath.   Objective:    General: Well Developed, well nourished, and in no acute distress.  Neuro: Alert and oriented x3, extra-ocular muscles intact, sensation grossly intact.  HEENT: Normocephalic, atraumatic, pupils equal round reactive to light, neck supple, no masses, no lymphadenopathy, thyroid nonpalpable. Oropharynx, nasopharynx unremarkable, ear canals unremarkable, there is circumferential swelling around the left eye, minimally erythematous and tender to palpation, no proptosis. No anterior chamber chemosis, no hyphema, no conjunctival injection. Vision is grossly normal. Skin: Warm and dry, no rashes. Cardiac: Regular rate and rhythm, no murmurs rubs or gallops, no lower extremity edema.  Respiratory: Clear to auscultation bilaterally. Not using accessory muscles, speaking in full sentences.  Impression and Recommendations:    Preseptal cellulitis of left eye Prednisone, doxycycline. Return as needed.

## 2016-04-01 NOTE — Patient Instructions (Signed)
Preseptal Cellulitis, Adult °Preseptal cellulitis--also called periorbital cellulitis--is an infection that can affect your eyelid and the soft tissues or skin that surround your eye. The infection may also affect the structures that produce and drain your tears. It does not affect your eye itself. °CAUSES °This condition may be caused by: °· Bacterial infection. °· Long-term (chronic) sinus infections. °· An object (foreign body) that is stuck behind the eye. °· An injury that: °¨ Goes through the eyelid tissues. °¨ Causes an infection, such as an insect sting. °· Fracture of the bone around the eye. °· Infections that have spread from the eyelid or other structures around the eye. °· Bite wounds. °· Inflammation or infection of the lining membranes of the brain (meningitis). °· An infection in the blood (septicemia). °· Dental infection (abscess). °· Viral infection. This is rare. °RISK FACTORS °Risk factors for preseptal cellulitis include: °· Participating in activities that increase your risk of trauma to the face or head, such as boxing or high-speed activities. °· Having a weakened defense system (immune system). °· Medical conditions, such as nasal polyps, that increase your risk for frequent or recurrent sinus infections. °· Not receiving regular dental care. °SYMPTOMS °Symptoms of this condition usually come on suddenly. Symptoms may include: °· Red, hot, and swollen eyelids. °· Fever. °· Difficulty opening your eye. °· Eye pain. °DIAGNOSIS °This condition may be diagnosed by an eye exam. You may also have tests, such as: °· Blood tests. °· CT scan. °· MRI. °· Spinal tap (lumbar puncture). This is a procedure that involves removing and examining a small amount of the fluid that surrounds the brain and spinal cord. This checks for meningitis. °TREATMENT °Treatment for this condition will include antibiotic medicines. These may be given by mouth (orally), through an IV, or as a shot. Your health care  provider may also recommend nasal decongestants to reduce swelling. °HOME CARE INSTRUCTIONS °· Take your antibiotic medicine as directed by your health care provider. Finish all of it even if you start to feel better. °· Take medicines only as directed by your health care provider. °· Drink enough fluid to keep your urine clear or pale yellow. °· Do not use any tobacco products, including cigarettes, chewing tobacco, or electronic cigarettes. If you need help quitting, ask your health care provider. °· Keep all follow-up visits as directed by your health care provider. These include any visits with an eye specialist (ophthalmologist) or dentist. °SEEK MEDICAL CARE IF: °· You have a fever. °· Your eyelids become more red, warm, or swollen. °· You have new symptoms. °· Your symptoms do not get better with treatment. °SEEK IMMEDIATE MEDICAL CARE IF: °· You develop double vision, or your vision becomes blurred or worsens in any way. °· You have trouble moving your eyes. °· Your eye looks like it is sticking out or bulging out (proptosis). °· You develop a severe headache, severe neck pain, or neck stiffness. °· You develop repeated vomiting. °  °This information is not intended to replace advice given to you by your health care provider. Make sure you discuss any questions you have with your health care provider. °  °Document Released: 07/05/2010 Document Revised: 10/17/2014 Document Reviewed: 05/29/2014 °Elsevier Interactive Patient Education ©2016 Else[employer] Inc. ° °

## 2016-04-01 NOTE — Assessment & Plan Note (Signed)
Prednisone, doxycycline. Return as needed.

## 2016-04-02 LAB — HPV TYPE 16 AND 18/45 RNA
HPV TYPE 16 RNA: NOT DETECTED
HPV Type 18/45 RNA: NOT DETECTED

## 2016-04-15 ENCOUNTER — Encounter: Payer: Self-pay | Admitting: Sports Medicine

## 2016-04-15 ENCOUNTER — Ambulatory Visit (INDEPENDENT_AMBULATORY_CARE_PROVIDER_SITE_OTHER): Payer: 59 | Admitting: Sports Medicine

## 2016-04-15 DIAGNOSIS — L03213 Periorbital cellulitis: Secondary | ICD-10-CM | POA: Diagnosis not present

## 2016-04-15 MED ORDER — DOXYCYCLINE HYCLATE 100 MG PO TABS: 100.0000 mg | ORAL_TABLET | Freq: Two times a day (BID) | ORAL | 0 refills | Status: AC

## 2016-04-15 MED ORDER — FLUTICASONE PROPIONATE 50 MCG/ACT NA SUSP: NASAL | 3 refills | Status: DC

## 2016-04-15 MED FILL — FLUTICASONE PROP 50 MCG SPR: 50 | 90 days supply | Qty: 48 | Fill #0

## 2016-04-15 MED FILL — DOXYCYCLINE HYCLATE 100 MG: 100 | 7 days supply | Qty: 14 | Fill #0

## 2016-04-15 NOTE — Progress Notes (Signed)
  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 This causes a bit of discomfort around her eye. Symptoms are mild, improving.  Past medical history:  Negative.  See flowsheet/record as well for more information.  Surgical history: Negative.  See flowsheet/record as well for more information.  Family history: Negative.  See flowsheet/record as well for more information.  Social history: Negative.  See flowsheet/record as well for more information.  Allergies, and medications have been entered into the medical record, reviewed, and no changes needed.   Review of Systems: No fevers, chills, night sweats, weight loss, chest pain, or shortness of breath.   Objective:    General: Well Developed, well nourished, and in no acute distress.  Neuro: Alert and oriented x3, extra-ocular muscles intact, sensation grossly intact.  HEENT: Normocephalic, atraumatic, pupils equal round reactive to light, neck supple, no masses, no lymphadenopathy, thyroid nonpalpable. Oropharynx, nasopharynx, ear canals unremarkable, when the lower eyelid on the left side is everted I can see what appears to be an internal hordeolum. No further evidence of periorbital swelling or erythema. No anterior chamber chemosis, no hyphema. Skin: Warm and dry, no rashes. Cardiac: Regular rate and rhythm, no murmurs rubs or gallops, no lower extremity edema.  Respiratory: Clear to auscultation bilaterally. Not using accessory muscles, speaking in full sentences.  Impression and Recommendations:    Preseptal cellulitis of left eye Improved significantly with doxycycline. She does appear to have an internal Hordeolum Adding an additional course of doxycycline and referral to  ophthalmology. Adding Flonase, does have some nasal congestion.

## 2016-04-15 NOTE — Assessment & Plan Note (Signed)
Improved significantly with doxycycline. She does appear to have an internal Hordeolum Adding an additional course of doxycycline and referral to ophthalmology. Adding Flonase, does have some nasal congestion.

## 2016-04-21 DIAGNOSIS — H0015 Chalazion left lower eyelid: Secondary | ICD-10-CM | POA: Diagnosis not present

## 2016-04-25 MED FILL — ZYLET EYE DROPS: 0.5-0.3 | 10 days supply | Qty: 5 | Fill #0

## 2016-04-29 ENCOUNTER — Ambulatory Visit (INDEPENDENT_AMBULATORY_CARE_PROVIDER_SITE_OTHER): Payer: 59 | Admitting: Sports Medicine

## 2016-04-29 ENCOUNTER — Encounter: Payer: Self-pay | Admitting: Sports Medicine

## 2016-04-29 DIAGNOSIS — L03213 Periorbital cellulitis: Secondary | ICD-10-CM | POA: Diagnosis not present

## 2016-04-29 NOTE — Assessment & Plan Note (Signed)
Has touched base with ophthalmology. Preseptal cellulitis has resolved with antibiotics, she was diagnosed with a lower hordeolum, treated with a topical steroid and antibiotic, improving but persistent. She does wear her contact lenses day and night for a month.  Return as needed.

## 2016-04-29 NOTE — Progress Notes (Signed)
  Subjective:    CC: Follow-up  HPI: Preseptal cellulitis has resolved, internal hordeolum, lower eyelid is pain-free with a topical steroid and antibiotic drops prescribed by off the mile[employer].  Past medical history:  Negative.  See flowsheet/record as well for more information.  Surgical history: Negative.  See flowsheet/record as well for more information.  Family history: Negative.  See flowsheet/record as well for more information.  Social history: Negative.  See flowsheet/record as well for more information.  Allergies, and medications have been entered into the medical record, reviewed, and no changes needed.   Review of Systems: No fevers, chills, night sweats, weight loss, chest pain, or shortness of breath.   Objective:    General: Well Developed, well nourished, and in no acute distress.  Neuro: Alert and oriented x3, extra-ocular muscles intact, sensation grossly intact.  HEENT: Normocephalic, atraumatic, pupils equal round reactive to light, neck supple, no masses, no lymphadenopathy, thyroid nonpalpable. Preseptal cellulitis is resolved, extraocular muscles intact, no proptosis, no anterior chamber chemosis or hyphema, there is a persistent lower eyelid hordeolum. Skin: Warm and dry, no rashes. Cardiac: Regular rate and rhythm, no murmurs rubs or gallops, no lower extremity edema.  Respiratory: Clear to auscultation bilaterally. Not using accessory muscles, speaking in full sentences.  Impression and Recommendations:    Preseptal cellulitis of left eye Has touched base with ophthalmology. Preseptal cellulitis has resolved with antibiotics, she was diagnosed with a lower hordeolum, treated with a topical steroid and antibiotic, improving but persistent. She does wear her contact lenses day and night for a month.  Return as needed.

## 2016-05-06 ENCOUNTER — Encounter: Payer: Self-pay | Admitting: Gynecology

## 2016-05-06 ENCOUNTER — Ambulatory Visit (INDEPENDENT_AMBULATORY_CARE_PROVIDER_SITE_OTHER): Payer: 59 | Admitting: Gynecology

## 2016-05-06 VITALS — BP 120/64

## 2016-05-06 DIAGNOSIS — R87810 Cervical high risk human papillomavirus (HPV) DNA test positive: Secondary | ICD-10-CM

## 2016-05-06 DIAGNOSIS — R87612 Low grade squamous intraepithelial lesion on cytologic smear of cervix (LGSIL): Secondary | ICD-10-CM | POA: Diagnosis not present

## 2016-05-06 NOTE — Addendum Note (Signed)
Addended by: Anastasio Auerbach on: 05/06/2016 03:30 PM   Modules accepted: Orders

## 2016-05-06 NOTE — Progress Notes (Signed)
    Kelly Tapia 31-Jul-1980 [id number]        35 y.o.  G0P0 presents for colposcopy. 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  Past medical history,surgical history, problem list, medications, allergies, family history and social history were all reviewed and documented in the EPIC chart.  Directed ROS with pertinent positives and negatives documented in the history of present illness/assessment and plan.  Exam: Caryn Bee assistant Vitals:   05/06/16 1419  BP: 120/64   General appearance:  Normal External BUS vagina normal. Cervix grossly normal. Uterus normal size midline mobile nontender. Adnexa without masses or tenderness.  Colposcopy performed after acetic acid cleanse is adequate and normal. ECC performed. Patient tolerated well. Physical Exam  Genitourinary:       Assessment/Plan:  35 y.o. 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 Colposcopy was adequate normal. ECC performed. I reviewed with the patient the whole issue of dysplasia, high-grade/low-grade, progression/regression and the HPV association. If ECC negative or low-grade and plan expectant management with follow up Pap smear one year. If otherwise then will triage based on results.    Anastasio Auerbach MD, 2:51 PM 05/06/2016

## 2016-05-06 NOTE — Patient Instructions (Signed)
Office will call you with biopsy results 

## 2016-05-07 ENCOUNTER — Encounter: Payer: Self-pay | Admitting: Gynecology

## 2016-05-07 LAB — PATHOLOGY

## 2016-05-16 MED FILL — NUVARING VAGINAL RING: 0.12-0.015 | 84 days supply | Qty: 3 | Fill #0

## 2016-08-19 MED FILL — VICTOZA 18 MG/3 ML INJECT P: 18 | 30 days supply | Qty: 9 | Fill #2

## 2016-08-19 MED FILL — NUVARING VAGINAL RING: 0.12-0.015 | 84 days supply | Qty: 3 | Fill #1

## 2016-10-29 ENCOUNTER — Encounter: Payer: Self-pay | Admitting: Gynecology

## 2016-11-14 MED FILL — NUVARING VAGINAL RING: 0.12-0.015 | 84 days supply | Qty: 3 | Fill #2

## 2017-01-31 IMAGING — DX DG ELBOW COMPLETE 3+V*R*
4 series · 4 of 4 positions shown · non-contrast
Comparison: None.

CLINICAL DATA: Right elbow pain.  Initial evaluation.

EXAM:
RIGHT ELBOW - COMPLETE 3+ VIEW

[elbow obl (1 of 2)]
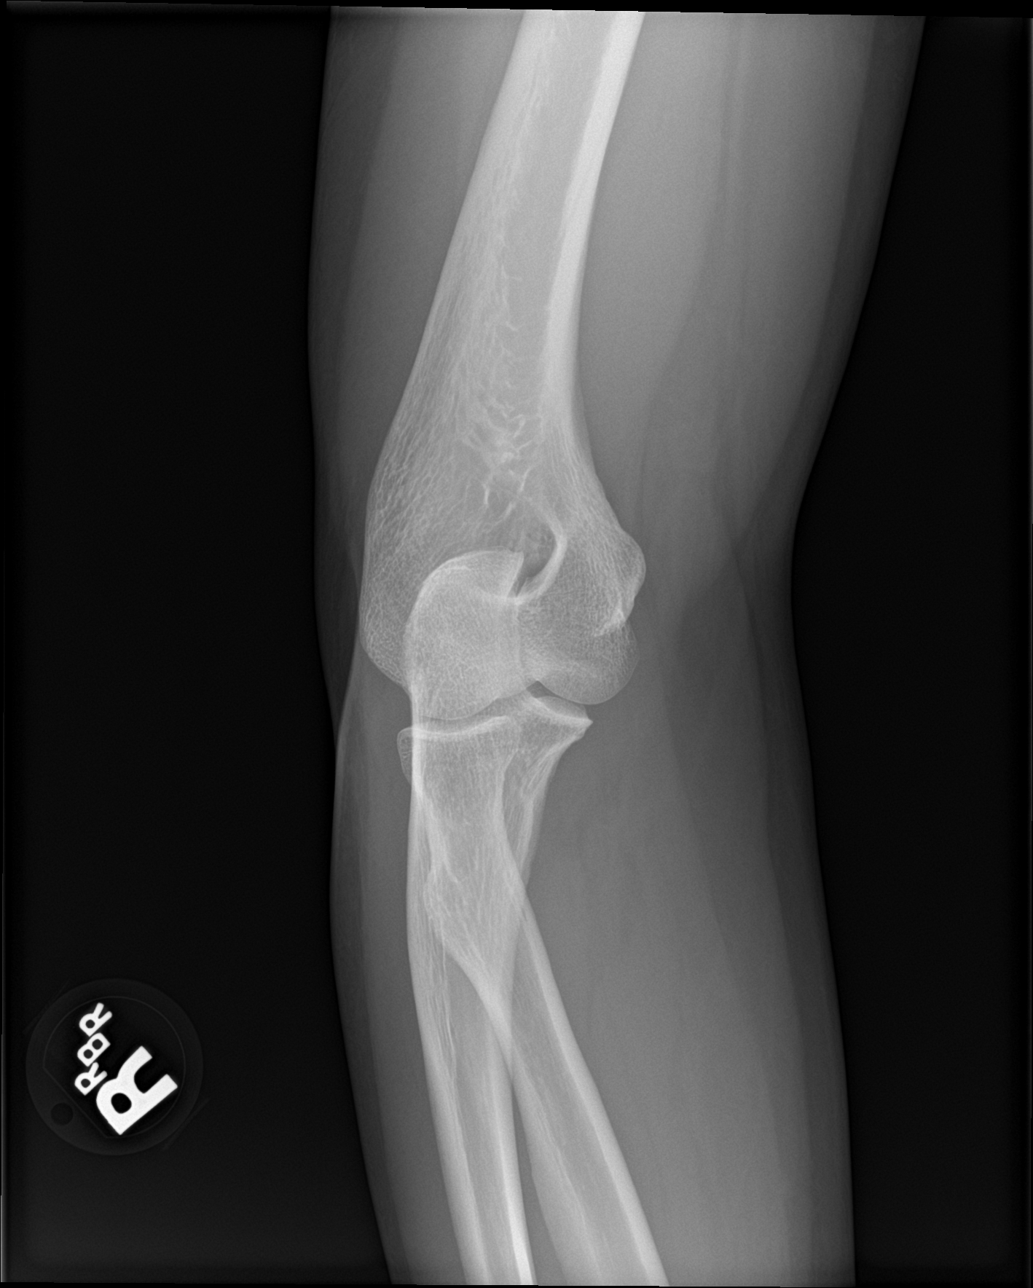

[elbow lat (1 of 2)]
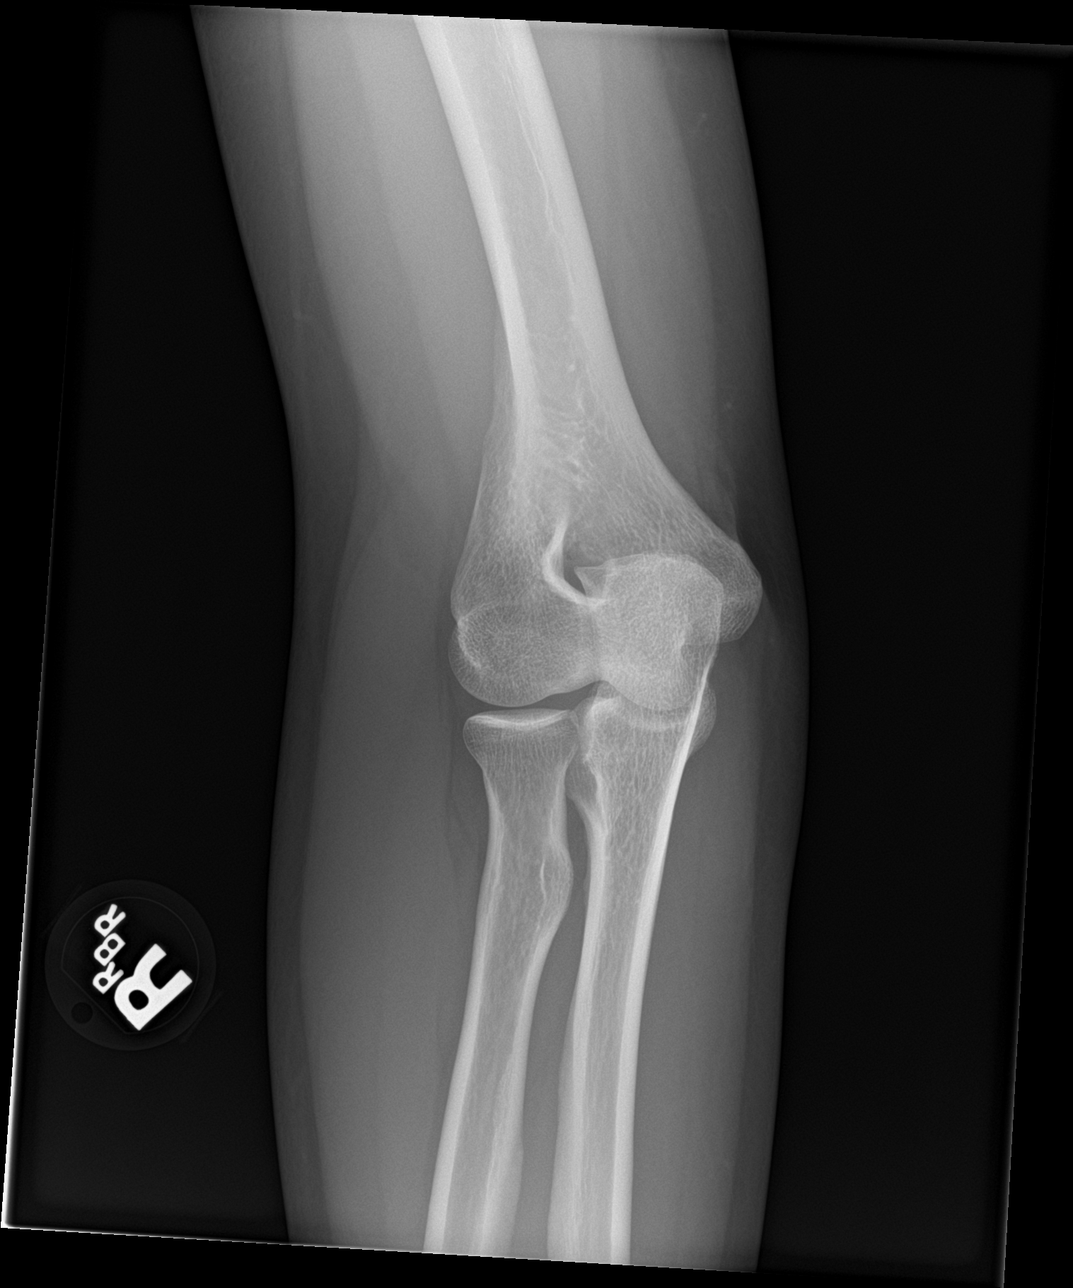

[elbow obl (2 of 2)]
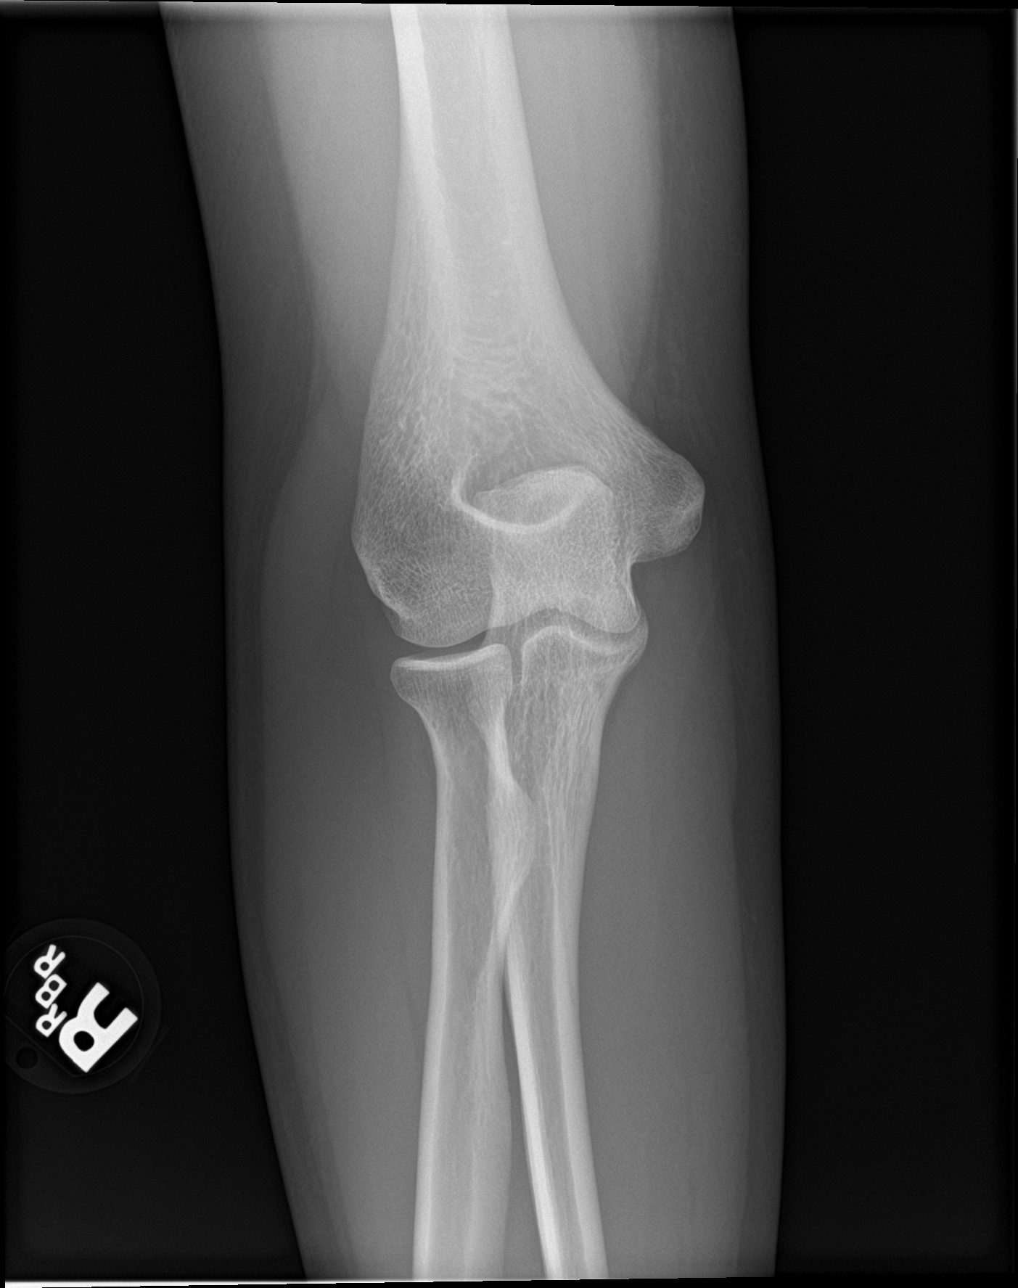

[elbow lat (2 of 2)]
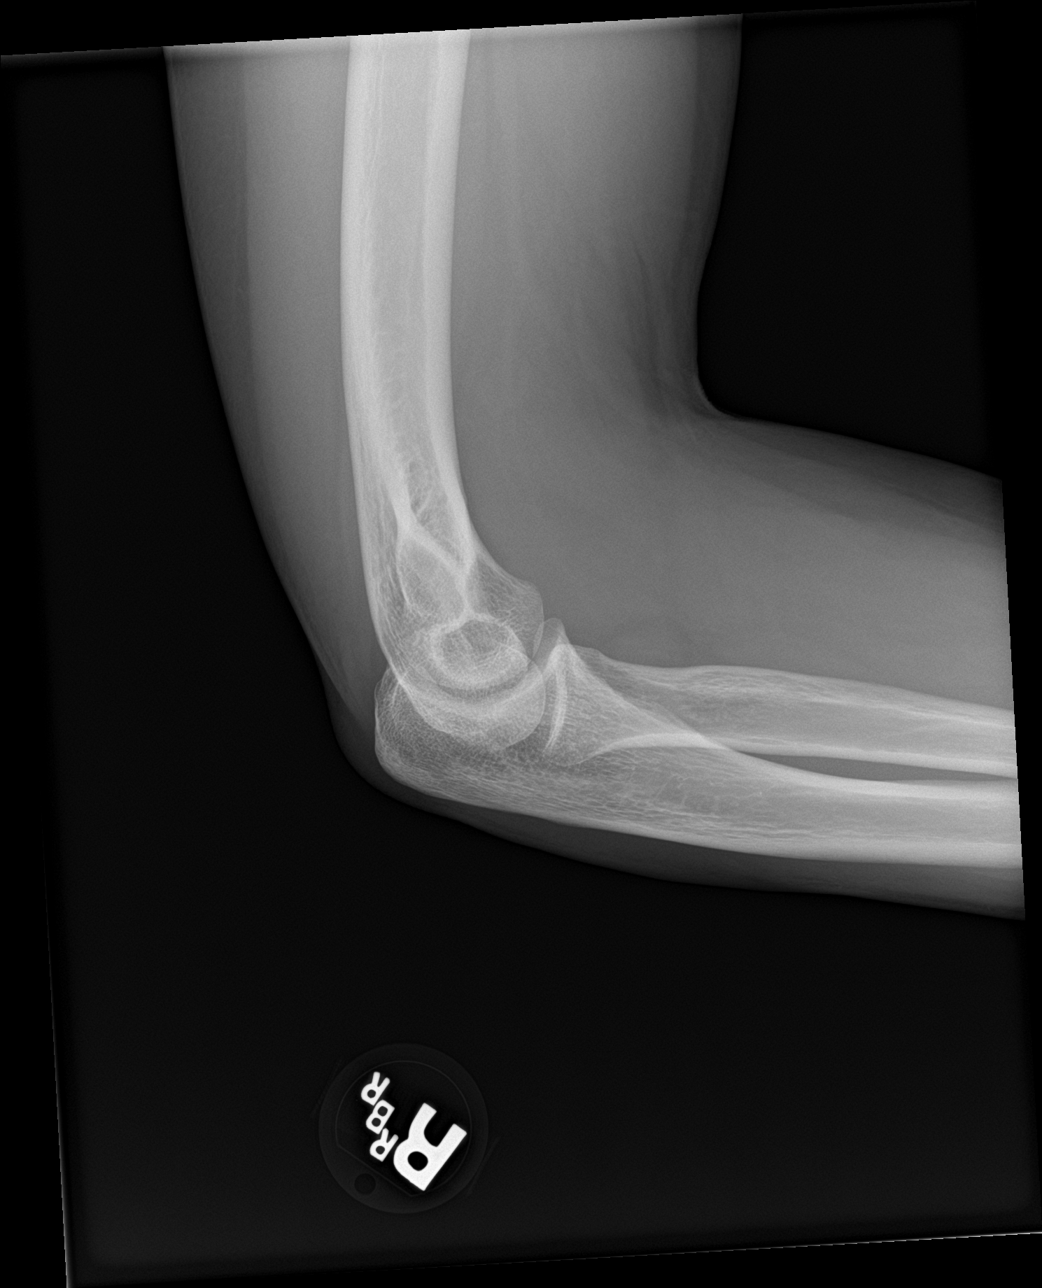

[4 of 4 positions shown; findings below may reference images not displayed]

FINDINGS: No acute bony or joint abnormality identified. No evidence of
fracture dislocation.
IMPRESSION: No acute or focal abnormality.

## 2017-02-20 MED FILL — NUVARING VAGINAL RING: 0.12-0.015 | 84 days supply | Qty: 3 | Fill #3

## 2017-05-19 ENCOUNTER — Other Ambulatory Visit: Payer: Self-pay | Admitting: Women's Health

## 2017-05-19 DIAGNOSIS — Z3049 Encounter for surveillance of other contraceptives: Secondary | ICD-10-CM

## 2017-05-19 MED FILL — NUVARING VAGINAL RING: 0.12-0.015 | 84 days supply | Qty: 3 | Fill #0

## 2017-07-07 ENCOUNTER — Ambulatory Visit (INDEPENDENT_AMBULATORY_CARE_PROVIDER_SITE_OTHER): Payer: 59 | Admitting: Women's Health

## 2017-07-07 ENCOUNTER — Encounter: Payer: Self-pay | Admitting: Women's Health

## 2017-07-07 VITALS — BP 120/82 | Ht 67.0 in | Wt 179.0 lb

## 2017-07-07 DIAGNOSIS — Z3049 Encounter for surveillance of other contraceptives: Secondary | ICD-10-CM | POA: Diagnosis not present

## 2017-07-07 DIAGNOSIS — Z01419 Encounter for gynecological examination (general) (routine) without abnormal findings: Secondary | ICD-10-CM

## 2017-07-07 DIAGNOSIS — R87610 Atypical squamous cells of undetermined significance on cytologic smear of cervix (ASC-US): Secondary | ICD-10-CM | POA: Diagnosis not present

## 2017-07-07 MED ORDER — ETONOGESTREL-ETHINYL ESTRADIOL 0.12-0.015 MG/24HR VA RING: VAGINAL_RING | VAGINAL | 4 refills | Status: DC

## 2017-07-07 NOTE — Progress Notes (Signed)
lab9

## 2017-07-07 NOTE — Patient Instructions (Signed)

## 2017-07-07 NOTE — Progress Notes (Signed)
Kelly Tapia 11/03/1970 [id number]    History:    Presents for annual exam.  Light monthly cycle on NuvaRing. 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 Anxiety and depression on no medication has had counseling. Labs at primary care. Desires no children. Has lost approximately 80 pounds and has maintained.   Past medical history, past surgical history, family history and social history were all reviewed and documented in the EPIC chart. 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  ROS:  A ROS was performed and pertinent positives and negatives are included.  Exam:  Vitals:   07/07/17 1531  BP: 120/82  Weight: 179 lb (81.2 kg)  Height: 5\' 7"  (1.702 m)   Body mass index is 28.04 kg/m.   General appearance:  Normal Thyroid:  Symmetrical, normal in size, without palpable masses or nodularity. Respiratory  Auscultation:  Clear without wheezing or rhonchi Cardiovascular  Auscultation:  Regular rate, without rubs, murmurs or gallops  Edema/varicosities:  Not grossly evident Abdominal  Soft,nontender, without masses, guarding or rebound.  Liver/spleen:  No organomegaly noted  Hernia:  None appreciated  Skin  Inspection:  Grossly normal   Breasts: Examined lying and sitting.     Right: Without masses, retractions, discharge or axillary adenopathy.     Left: Without masses, retractions, discharge or axillary adenopathy. Gentitourinary   Inguinal/mons:  Normal without inguinal adenopathy  External genitalia:  Normal  BUS/Urethra/Skene's glands:  Normal  Vagina:  Normal  Cervix:  Normal  Uterus:   normal in size, shape and contour.  Midline and mobile  Adnexa/parametria:     Rt: Without masses or tenderness.   Lt: Without masses or tenderness.  Anus and perineum: Normal  Digital rectal exam: Normal sphincter tone without palpated masses or tenderness  Assessment/Plan:  37 y.o. D WF G0  for annual  exam with no complaints.  Light monthly cycle on NuvaRing 04/2016 CIN-1 with negative colposcopy and biopsy Labs-primary care  Plan: NuvaRing prescription, proper use, slight risk for blood clots and strokes reviewed. SBE's, exercise, calcium rich diet, vitamin D 1000 daily encouraged. History abnormal mole on back, strongly encouraged annual skin check. Pap with HR HPV typing.    Huel Cote [facility], 5:10 PM 07/07/2017

## 2017-07-08 LAB — URINALYSIS W MICROSCOPIC + REFLEX CULTURE
BILIRUBIN URINE: NEGATIVE
Bacteria, UA: NONE SEEN /HPF
GLUCOSE, UA: NEGATIVE
HYALINE CAST: NONE SEEN /LPF
Hgb urine dipstick: NEGATIVE
Ketones, ur: NEGATIVE
Leukocyte Esterase: NEGATIVE
NITRITES URINE, INITIAL: NEGATIVE
PH: 5.5 (ref 5.0–8.0)
Protein, ur: NEGATIVE
RBC / HPF: NONE SEEN /HPF (ref 0–2)
Specific Gravity, Urine: 1.024 (ref 1.001–1.03)

## 2017-07-10 ENCOUNTER — Encounter: Payer: Self-pay | Admitting: Women's Health

## 2017-07-10 LAB — PAP, TP IMAGING W/ HPV RNA, RFLX HPV TYPE 16,18/45: HPV DNA High Risk: NOT DETECTED

## 2017-07-20 MED FILL — DUREZOL 0.05% EYE DROPS: 0.05 | 17 days supply | Qty: 5 | Fill #0

## 2017-07-20 MED FILL — GATIFLOXACIN 0.5% EYE DROPS: 0.5 | 8 days supply | Qty: 3 | Fill #0

## 2017-07-24 MED FILL — ZOLPIDEM TARTRATE 10 MG TAB: 10 | 5 days supply | Qty: 5 | Fill #0

## 2017-07-27 MED FILL — GENTAMICIN 3 MG/ML EYE DRP: 0.3 | 17 days supply | Qty: 5 | Fill #0

## 2017-08-17 MED FILL — NUVARING VAGINAL RING: 0.12-0.015 | 84 days supply | Qty: 3 | Fill #0

## 2017-11-13 MED FILL — NUVARING VAGINAL RING: 0.12-0.015 | 84 days supply | Qty: 3 | Fill #1

## 2017-12-21 ENCOUNTER — Telehealth: Payer: Self-pay | Admitting: *Deleted

## 2017-12-21 ENCOUNTER — Encounter: Payer: Self-pay | Admitting: Women's Health

## 2017-12-21 ENCOUNTER — Ambulatory Visit (INDEPENDENT_AMBULATORY_CARE_PROVIDER_SITE_OTHER): Payer: 59 | Admitting: Women's Health

## 2017-12-21 VITALS — BP 124/82

## 2017-12-21 DIAGNOSIS — Z3044 Encounter for surveillance of vaginal ring hormonal contraceptive device: Secondary | ICD-10-CM

## 2017-12-21 NOTE — Progress Notes (Signed)
37 year old D WF G0 presents with complaint of questionable lost Nuva ring.  States placed 2 days ago at scheduled time,  questions if it came out when pulling out a tampon.  States checks placement daily, and unable to feel today.  Desires no children.  Same partner with no change.  Denies vaginal discharge, abdominal pain, urinary symptoms, fever.  Exam: Appears well.  Speculum exam no visible nuva ring, bimanual no NuvaRing palpated, no CMT.  Contraception management  Plan: Sample NuvaRing given and placed per patient.

## 2017-12-21 NOTE — Telephone Encounter (Signed)
patient called c/o lost nuvaring, states her cycle was on and she removed tampon and didn't see nuvaring and still unable to locate nuvaring asked what to do. I told her she can schedule visit with provider to check and confirm no ring in vagina. ,transferred to appointment desk.

## 2018-02-02 ENCOUNTER — Ambulatory Visit: Payer: Self-pay | Admitting: Nurse Practitioner

## 2018-02-02 VITALS — BP 115/75 | HR 84 | Temp 99.1°F | Resp 16 | Wt 190.6 lb

## 2018-02-02 DIAGNOSIS — J020 Streptococcal pharyngitis: Secondary | ICD-10-CM

## 2018-02-02 LAB — POCT RAPID STREP A (OFFICE): Rapid Strep A Screen: POSITIVE — AB

## 2018-02-02 MED ORDER — AZITHROMYCIN 250 MG PO TABS: ORAL_TABLET | ORAL | 0 refills | Status: DC

## 2018-02-02 MED ORDER — FLUCONAZOLE 150 MG PO TABS: 150.0000 mg | ORAL_TABLET | Freq: Once | ORAL | 0 refills | Status: AC

## 2018-02-02 MED FILL — FLUCONAZOLE 150 MG TABS: 150 | 3 days supply | Qty: 3 | Fill #0

## 2018-02-02 MED FILL — AZITHROMYCIN 250 MG TABLET: 250 | 5 days supply | Qty: 6 | Fill #0

## 2018-02-02 NOTE — Progress Notes (Signed)
   Subjective:    Patient ID: Kelly Tapia, female    DOB: 10-02-1980, 37 y.o.   MRN: [id number]  Sore Throat   This is a new problem. The current episode started yesterday. The problem has been gradually worsening. The pain is worse on the right side. The maximum temperature recorded prior to her arrival was 100.4 - 100.9 F. The fever has been present for less than 1 day. The pain is moderate. Associated symptoms include swollen glands and trouble swallowing. Pertinent negatives include no congestion, coughing, drooling, ear discharge, ear pain, headaches or vomiting. She has had exposure to strep. 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 She has tried NSAIDs for the symptoms. The treatment provided mild relief.   Reviewed the patient's past medical history, current medications, and allergies.   Review of Systems  Constitutional: Positive for activity change, appetite change, fatigue and fever. Negative for chills and diaphoresis.  HENT: Positive for sore throat and trouble swallowing. Negative for congestion, drooling, ear discharge, ear pain, postnasal drip, sinus pressure and sinus pain.   Eyes: Negative.   Respiratory: Negative.  Negative for cough.   Cardiovascular: Negative.   Gastrointestinal: Negative for nausea and vomiting.  Skin: Negative.   Neurological: Negative for headaches.       Objective:   Physical Exam  Constitutional: She appears well-developed and well-nourished. She appears ill.  HENT:  Head: Normocephalic and atraumatic.  Right Ear: Hearing, tympanic membrane and ear canal normal. No drainage.  Left Ear: Hearing, tympanic membrane and ear canal normal. No drainage.  Mouth/Throat: Mucous membranes are normal. No oral lesions. Uvula swelling present. Posterior oropharyngeal edema and posterior oropharyngeal erythema present. No oropharyngeal exudate. Tonsils are 2+ on the right. Tonsils are 2+ on the left. Tonsillar exudate.  Eyes: Pupils are equal,  round, and reactive to light. EOM are normal.  Neck: Normal range of motion. Neck supple. No thyromegaly present.  Cardiovascular: Normal rate, regular rhythm and normal heart sounds.  Pulmonary/Chest: Effort normal and breath sounds normal.  Abdominal: Soft. Bowel sounds are normal. There is no tenderness.  Neurological: She is alert.  Skin: Skin is warm and dry. Capillary refill takes 2 to 3 seconds.      Assessment & Plan:  Exam findings, diagnosis etiology and medication use and indications reviewed with patient. Follow- Up and discharge instructions provided. No emergent/urgent issues found on exam.  Patient verbalized understanding of information provided and agrees with plan of care (POC), all questions answered.  1. Strep throat  - azithromycin (ZITHROMAX) 250 MG tablet; Take as directed.  Dispense: 6 tablet; Refill: 0 - POCT rapid strep A -Take medications as prescribed. -Ibuprofen 800mg  every 8 hours for three days for throat pain and inflammation. -Increase fluids. -Warm saltwater gargles as needed for throat pain. -Change toothbrush after 3 days. -Remain out of work until on antibiotics for 24 hours. -Follow up in the ER if you are unable to swallow, drooling, fever or other concerns.

## 2018-02-02 NOTE — Patient Instructions (Addendum)
Strep Throat  -Take medications as prescribed. -Ibuprofen 800mg  every 8 hours for three days for throat pain and inflammation. -Increase fluids. -Warm saltwater gargles as needed for throat pain. -Change toothbrush after 3 days. -Remain out of work until on antibiotics for 24 hours. -Follow up in the ER if you are unable to swallow, drooling, fever or other concerns.    Strep throat is a bacterial infection of the throat. Your health care provider may call the infection tonsillitis or pharyngitis, depending on whether there is swelling in the tonsils or at the back of the throat. Strep throat is most common during the cold months of the year in children who are 5-56 years of age, but it can happen during any season in people of any age. This infection is spread from person to person (contagious) through coughing, sneezing, or close contact. What are the causes? Strep throat is caused by the bacteria called Streptococcus pyogenes. What increases the risk? This condition is more likely to develop in:  People who spend time in crowded places where the infection can spread easily.  People who have close contact with someone who has strep throat.  What are the signs or symptoms? Symptoms of this condition include:  Fever or chills.  Redness, swelling, or pain in the tonsils or throat.  Pain or difficulty when swallowing.  White or yellow spots on the tonsils or throat.  Swollen, tender glands in the neck or under the jaw.  Red rash all over the body (rare).  How is this diagnosed? This condition is diagnosed by performing a rapid strep test or by taking a swab of your throat (throat culture test). Results from a rapid strep test are usually ready in a few minutes, but throat culture test results are available after one or two days. How is this treated? This condition is treated with antibiotic medicine. Follow these instructions at home: Medicines  Take over-the-counter and  prescription medicines only as told by your health care provider.  Take your antibiotic as told by your health care provider. Do not stop taking the antibiotic even if you start to feel better.  Have family members who also have a sore throat or fever tested for strep throat. They may need antibiotics if they have the strep infection. Eating and drinking  Do not share food, drinking cups, or personal items that could cause the infection to spread to other people.  If swallowing is difficult, try eating soft foods until your sore throat feels better.  Drink enough fluid to keep your urine clear or pale yellow. General instructions  Gargle with a salt-water mixture 3-4 times per day or as needed. To make a salt-water mixture, completely dissolve -1 tsp of salt in 1 cup of warm water.  Make sure that all household members wash their hands well.  Get plenty of rest.  Stay home from school or work until you have been taking antibiotics for 24 hours.  Keep all follow-up visits as told by your health care provider. This is important. Contact a health care provider if:  The glands in your neck continue to get bigger.  You develop a rash, cough, or earache.  You cough up a thick liquid that is green, yellow-brown, or bloody.  You have pain or discomfort that does not get better with medicine.  Your problems seem to be getting worse rather than better.  You have a fever. Get help right away if:  You have new symptoms, such as  vomiting, severe headache, stiff or painful neck, chest pain, or shortness of breath.  You have severe throat pain, drooling, or changes in your voice.  You have swelling of the neck, or the skin on the neck becomes red and tender.  You have signs of dehydration, such as fatigue, dry mouth, and decreased urination.  You become increasingly sleepy, or you cannot wake up completely.  Your joints become red or painful. This information is not intended to  replace advice given to you by your health care provider. Make sure you discuss any questions you have with your health care provider. Document Released: 05/30/2000 Document Revised: 01/30/2016 Document Reviewed: 09/25/2014 Elsevier Interactive Patient Education  [employer].

## 2018-02-04 ENCOUNTER — Telehealth: Payer: Self-pay

## 2018-02-04 NOTE — Telephone Encounter (Signed)
Patient did not answered the phone 

## 2018-02-17 MED FILL — NUVARING VAGINAL RING: 0.12-0.015 | 84 days supply | Qty: 3 | Fill #2

## 2018-05-17 MED FILL — NUVARING VAGINAL RING: 0.12-0.015 | 84 days supply | Qty: 3 | Fill #3

## 2018-08-18 ENCOUNTER — Other Ambulatory Visit: Payer: Self-pay | Admitting: Women's Health

## 2018-08-18 DIAGNOSIS — Z3049 Encounter for surveillance of other contraceptives: Secondary | ICD-10-CM

## 2018-08-19 ENCOUNTER — Telehealth: Payer: Self-pay | Admitting: *Deleted

## 2018-08-19 DIAGNOSIS — Z3049 Encounter for surveillance of other contraceptives: Secondary | ICD-10-CM

## 2018-08-19 MED ORDER — ETONOGESTREL-ETHINYL ESTRADIOL 0.12-0.015 MG/24HR VA RING: VAGINAL_RING | VAGINAL | 0 refills | Status: DC

## 2018-08-19 NOTE — Telephone Encounter (Signed)
Patient has annual exam scheduled on 09/21/18, needs refill on nuvaring. Rx sent.

## 2018-08-23 MED FILL — ETONOGESTREL-ETHINYL ESTRAD: 0.12-0.015 | 84 days supply | Qty: 3 | Fill #0

## 2018-09-17 ENCOUNTER — Other Ambulatory Visit: Payer: Self-pay

## 2018-09-21 ENCOUNTER — Ambulatory Visit (INDEPENDENT_AMBULATORY_CARE_PROVIDER_SITE_OTHER): Payer: Managed Care, Other (non HMO) | Admitting: Women's Health

## 2018-09-21 ENCOUNTER — Encounter: Payer: Self-pay | Admitting: Women's Health

## 2018-09-21 ENCOUNTER — Other Ambulatory Visit: Payer: Self-pay

## 2018-09-21 VITALS — BP 120/72 | Ht 66.5 in | Wt 178.0 lb

## 2018-09-21 DIAGNOSIS — Z13220 Encounter for screening for lipoid disorders: Secondary | ICD-10-CM

## 2018-09-21 DIAGNOSIS — Z01419 Encounter for gynecological examination (general) (routine) without abnormal findings: Secondary | ICD-10-CM | POA: Diagnosis not present

## 2018-09-21 DIAGNOSIS — Z3049 Encounter for surveillance of other contraceptives: Secondary | ICD-10-CM

## 2018-09-21 MED ORDER — ETONOGESTREL-ETHINYL ESTRADIOL 0.12-0.015 MG/24HR VA RING: VAGINAL_RING | VAGINAL | 4 refills | Status: DC

## 2018-09-21 NOTE — Patient Instructions (Signed)

## 2018-09-21 NOTE — Progress Notes (Signed)
Kelly Tapia 02/22/1981 [id number]    History:    Presents for annual exam.  Monthly cycle on NuvaRing without complaint.  Same partner.  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  2019 Pap ASCUS with negative high risk HPV.  Received 2 doses of Gardasil.  Weight is down 80 pounds and has maintained.  Desires no children.  Past medical history, past surgical history, family history and social history were all reviewed and documented in the EPIC chart.  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  Mother COPD and diabetes.  ROS:  A ROS was performed and pertinent positives and negatives are included.  Exam:  Vitals:   09/21/18 1213  BP: 120/72  Weight: 178 lb (80.7 kg)  Height: 5' 6.5" (1.689 m)   Body mass index is 28.3 kg/m.   General appearance:  Normal Thyroid:  Symmetrical, normal in size, without palpable masses or nodularity. Respiratory  Auscultation:  Clear without wheezing or rhonchi Cardiovascular  Auscultation:  Regular rate, without rubs, murmurs or gallops  Edema/varicosities:  Not grossly evident Abdominal  Soft,nontender, without masses, guarding or rebound.  Liver/spleen:  No organomegaly noted  Hernia:  None appreciated  Skin  Inspection:  Grossly normal   Breasts: Examined lying and sitting.     Right: Without masses, retractions, discharge or axillary adenopathy.     Left: Without masses, retractions, discharge or axillary adenopathy. Gentitourinary   Inguinal/mons:  Normal without inguinal adenopathy  External genitalia:  Normal  BUS/Urethra/Skene's glands:  Normal  Vagina:  Normal  Cervix:  Normal  Uterus: normal in size, shape and contour.  Midline and mobile  Adnexa/parametria:     Rt: Without masses or tenderness.   Lt: Without masses or tenderness.  Anus and perineum: Normal  Digital rectal exam: Normal sphincter tone without palpated masses or tenderness  Assessment/Plan:  38 y.o. D WF G0 for  annual exam with no complaints.  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  SBEs, continue healthy lifestyle of regular exercise, healthy diet, vitamin D 1000 daily encouraged.  CBC, glucose, lipid panel, Pap.    [place], 12:44 PM 09/21/2018

## 2018-09-22 LAB — PAP IG W/ RFLX HPV ASCU

## 2018-09-22 LAB — CBC WITH DIFFERENTIAL/PLATELET
Absolute Monocytes: 564 cells/uL (ref 200–950)
Basophils Absolute: 19 cells/uL (ref 0–200)
Basophils Relative: 0.3 %
Eosinophils Absolute: 31 cells/uL (ref 15–500)
Eosinophils Relative: 0.5 %
HCT: 36.4 % (ref 35.0–45.0)
Hemoglobin: 12.3 g/dL (ref 11.7–15.5)
Lymphs Abs: 1941 cells/uL (ref 850–3900)
MCH: 32.4 pg (ref 27.0–33.0)
MCHC: 33.8 g/dL (ref 32.0–36.0)
MCV: 95.8 fL (ref 80.0–100.0)
MPV: 10.6 fL (ref 7.5–12.5)
Monocytes Relative: 9.1 %
Neutro Abs: 3646 cells/uL (ref 1500–7800)
Neutrophils Relative %: 58.8 %
Platelets: 286 10*3/uL (ref 140–400)
RBC: 3.8 10*6/uL (ref 3.80–5.10)
RDW: 12.5 % (ref 11.0–15.0)
Total Lymphocyte: 31.3 %
WBC: 6.2 10*3/uL (ref 3.8–10.8)

## 2018-09-22 LAB — LIPID PANEL
Cholesterol: 205 mg/dL — ABNORMAL HIGH (ref <200)
HDL: 85 mg/dL (ref >=50)
LDL Cholesterol (Calc): 101 mg/dL (calc) — ABNORMAL HIGH
Non-HDL Cholesterol (Calc): 120 mg/dL (calc) (ref <130)
Total CHOL/HDL Ratio: 2.4 (calc) (ref <5.0)
Triglycerides: 93 mg/dL (ref <150)

## 2018-09-22 LAB — GLUCOSE, RANDOM: Glucose, Bld: 81 mg/dL (ref 65–99)

## 2019-03-08 ENCOUNTER — Encounter: Payer: Self-pay | Admitting: Gynecology

## 2019-11-18 ENCOUNTER — Other Ambulatory Visit: Payer: Self-pay

## 2019-11-21 ENCOUNTER — Ambulatory Visit (INDEPENDENT_AMBULATORY_CARE_PROVIDER_SITE_OTHER): Payer: BLUE CROSS/BLUE SHIELD | Admitting: Sports Medicine

## 2019-11-21 ENCOUNTER — Encounter: Payer: Self-pay | Admitting: Sports Medicine

## 2019-11-21 ENCOUNTER — Other Ambulatory Visit: Payer: Self-pay

## 2019-11-21 ENCOUNTER — Encounter: Payer: Self-pay | Admitting: Nurse Practitioner

## 2019-11-21 ENCOUNTER — Ambulatory Visit (INDEPENDENT_AMBULATORY_CARE_PROVIDER_SITE_OTHER): Payer: BLUE CROSS/BLUE SHIELD | Admitting: Nurse Practitioner

## 2019-11-21 VITALS — BP 112/73 | HR 90 | Temp 98.0°F | Ht 67.0 in | Wt 184.0 lb

## 2019-11-21 VITALS — BP 116/70 | Ht 66.5 in | Wt 181.0 lb

## 2019-11-21 DIAGNOSIS — L989 Disorder of the skin and subcutaneous tissue, unspecified: Secondary | ICD-10-CM

## 2019-11-21 DIAGNOSIS — N920 Excessive and frequent menstruation with regular cycle: Secondary | ICD-10-CM | POA: Diagnosis not present

## 2019-11-21 DIAGNOSIS — Z0000 Encounter for general adult medical examination without abnormal findings: Secondary | ICD-10-CM | POA: Diagnosis not present

## 2019-11-21 DIAGNOSIS — Z01419 Encounter for gynecological examination (general) (routine) without abnormal findings: Secondary | ICD-10-CM

## 2019-11-21 DIAGNOSIS — Z3049 Encounter for surveillance of other contraceptives: Secondary | ICD-10-CM | POA: Diagnosis not present

## 2019-11-21 DIAGNOSIS — Z23 Encounter for immunization: Secondary | ICD-10-CM

## 2019-11-21 DIAGNOSIS — E559 Vitamin D deficiency, unspecified: Secondary | ICD-10-CM

## 2019-11-21 MED ORDER — ETONOGESTREL-ETHINYL ESTRADIOL 0.12-0.015 MG/24HR VA RING: VAGINAL_RING | VAGINAL | 4 refills | Status: DC

## 2019-11-21 NOTE — Patient Instructions (Signed)
Health Maintenance, Female Adopting a healthy lifestyle and getting preventive care are important in promoting health and wellness. Ask your health care provider about:  The right schedule for you to have regular tests and exams.  Things you can do on your own to prevent diseases and keep yourself healthy. What should I know about diet, weight, and exercise? Eat a healthy diet   Eat a diet that includes plenty of vegetables, fruits, low-fat dairy products, and lean protein.  Do not eat a lot of foods that are high in solid fats, added sugars, or sodium. Maintain a healthy weight Body mass index (BMI) is used to identify weight problems. It estimates body fat based on height and weight. Your health care provider can help determine your BMI and help you achieve or maintain a healthy weight. Get regular exercise Get regular exercise. This is one of the most important things you can do for your health. Most adults should:  Exercise for at least 150 minutes each week. The exercise should increase your heart rate and make you sweat (moderate-intensity exercise).  Do strengthening exercises at least twice a week. This is in addition to the moderate-intensity exercise.  Spend less time sitting. Even light physical activity can be beneficial. Watch cholesterol and blood lipids Have your blood tested for lipids and cholesterol at 38 years of age, then have this test every 5 years. Have your cholesterol levels checked more often if:  Your lipid or cholesterol levels are high.  You are older than 38 years of age.  You are at high risk for heart disease. What should I know about cancer screening? Depending on your health history and family history, you may need to have cancer screening at various ages. This may include screening for:  Breast cancer.  Cervical cancer.  Colorectal cancer.  Skin cancer.  Lung cancer. What should I know about heart disease, diabetes, and high blood  pressure? Blood pressure and heart disease  High blood pressure causes heart disease and increases the risk of stroke. This is more likely to develop in people who have high blood pressure readings, are of African descent, or are overweight.  Have your blood pressure checked: ? Every 3-5 years if you are 18-39 years of age. ? Every year if you are 40 years old or older. Diabetes Have regular diabetes screenings. This checks your fasting blood sugar level. Have the screening done:  Once every three years after age 40 if you are at a normal weight and have a low risk for diabetes.  More often and at a younger age if you are overweight or have a high risk for diabetes. What should I know about preventing infection? Hepatitis B If you have a higher risk for hepatitis B, you should be screened for this virus. Talk with your health care provider to find out if you are at risk for hepatitis B infection. Hepatitis C Testing is recommended for:  Everyone born from 1945 through 1965.  Anyone with known risk factors for hepatitis C. Sexually transmitted infections (STIs)  Get screened for STIs, including gonorrhea and chlamydia, if: ? You are sexually active and are younger than 38 years of age. ? You are older than 38 years of age and your health care provider tells you that you are at risk for this type of infection. ? Your sexual activity has changed since you were last screened, and you are at increased risk for chlamydia or gonorrhea. Ask your health care provider if   you are at risk.  Ask your health care provider about whether you are at high risk for HIV. Your health care provider may recommend a prescription medicine to help prevent HIV infection. If you choose to take medicine to prevent HIV, you should first get tested for HIV. You should then be tested every 3 months for as long as you are taking the medicine. Pregnancy  If you are about to stop having your period (premenopausal) and  you may become pregnant, seek counseling before you get pregnant.  Take 400 to 800 micrograms (mcg) of folic acid every day if you become pregnant.  Ask for birth control (contraception) if you want to prevent pregnancy. Osteoporosis and menopause Osteoporosis is a disease in which the bones lose minerals and strength with aging. This can result in bone fractures. If you are 65 years old or older, or if you are at risk for osteoporosis and fractures, ask your health care provider if you should:  Be screened for bone loss.  Take a calcium or vitamin D supplement to lower your risk of fractures.  Be given hormone replacement therapy (HRT) to treat symptoms of menopause. Follow these instructions at home: Lifestyle  Do not use any products that contain nicotine or tobacco, such as cigarettes, e-cigarettes, and chewing tobacco. If you need help quitting, ask your health care provider.  Do not use street drugs.  Do not share needles.  Ask your health care provider for help if you need support or information about quitting drugs. Alcohol use  Do not drink alcohol if: ? Your health care provider tells you not to drink. ? You are pregnant, may be pregnant, or are planning to become pregnant.  If you drink alcohol: ? Limit how much you use to 0-1 drink a day. ? Limit intake if you are breastfeeding.  Be aware of how much alcohol is in your drink. In the [place] one drink equals one 12 oz bottle of beer (355 mL), one 5 oz glass of wine (148 mL), or one 1 oz glass of hard liquor (44 mL). General instructions  Schedule regular health, dental, and eye exams.  Stay current with your vaccines.  Tell your health care provider if: ? You often feel depressed. ? You have ever been abused or do not feel safe at home. Summary  Adopting a healthy lifestyle and getting preventive care are important in promoting health and wellness.  Follow your health care provider's instructions about healthy  diet, exercising, and getting tested or screened for diseases.  Follow your health care provider's instructions on monitoring your cholesterol and blood pressure. This information is not intended to replace advice given to you by your health care provider. Make sure you discuss any questions you have with your health care provider. Document Revised: 05/26/2018 Document Reviewed: 05/26/2018 Elsevier Patient Education  2020 Elsevier Inc.  

## 2019-11-21 NOTE — Assessment & Plan Note (Signed)
Kelly Tapia also has a suspicious skin lesion on the top of her right shoulder, it has pearly raised edges, circular, challenge ectasias concerning for basal cell carcinoma. I would like her to return for shave biopsy with aggressive hyfrecation, I can do this in a 15-minute slot.

## 2019-11-21 NOTE — Assessment & Plan Note (Addendum)
Annual physical as above. Checking routine labs. Tdap today. She recently got cervical cancer screening done so she should be up-to-date on everything.

## 2019-11-21 NOTE — Progress Notes (Signed)
Subjective:    CC: Annual Physical Exam  HPI:  This patient is here for their annual physical  I reviewed the past medical history, family history, social history, surgical history, and allergies today and no changes were needed.  Please see the problem list section below in epic for further details.  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 Colposcopy normal with negative ECC   Rubella immune 2009   Past Surgical History: Past Surgical History:  Procedure Laterality Date   COLPOSCOPY     REFRACTIVE SURGERY     Social History: Social History   Socioeconomic History   Marital status: Divorced    Spouse name: Not on file   Number of children: Not on file   Years of education: Not on file   Highest education level: Not on file  Occupational History   Not on file  Tobacco Use   Smoking status: Former Smoker    Types: Cigarettes    Quit date: 12/15/2003    Years since quitting: 15.9   Smokeless tobacco: Never Used  Substance and Sexual Activity   Alcohol use: Not Currently   Drug use: No   Sexual activity: Yes    Partners: Male    Birth control/protection: Other-see comments, Inserts    Comment: 1st intercourse- 27, partners- 46, current partner- 3 yrs   Other Topics Concern   Not on file  Social History Narrative   Not on file   Social Determinants of Health   Financial Resource Strain:    Difficulty of Paying Living Expenses:   Food Insecurity:    Worried About Charity fundraiser in the Last Year:    Arboriculturist in the Last Year:   Transportation Needs:    Film/video editor (Medical):    Lack of Transportation (Non-Medical):   Physical Activity:    Days of Exercise per Week:    Minutes of Exercise per Session:     Stress:    Feeling of Stress :   Social Connections:    Frequency of Communication with Friends and Family:    Frequency of Social Gatherings with Friends and Family:    Attends Religious Services:    Active Member of Clubs or Organizations:    Attends Music therapist:    Marital Status:    Family History: Family History  Problem Relation Age of Onset   Leukemia Father    Hypertension Mother    Hypertension Maternal Grandfather    Cancer Maternal Grandfather        prostate   Allergies: Allergies  Allergen Reactions   Penicillins Rash   Medications: See med rec.  Review of Systems: No headache, visual changes, nausea, vomiting, diarrhea, constipation, dizziness, abdominal pain, skin rash, fevers, chills, night sweats, swollen lymph nodes, weight loss, chest pain, body aches, joint swelling, muscle aches, shortness of breath, mood changes, visual or auditory hallucinations.  Objective:    General: Well Developed, well nourished, and in no acute distress.  Neuro: Alert and oriented x3, extra-ocular muscles intact, sensation grossly intact. Cranial nerves II through XII are intact, motor, sensory, and coordinative functions are all intact. HEENT: Normocephalic, atraumatic, pupils equal round reactive to light, neck supple, no masses, no lymphadenopathy, thyroid nonpalpable.  Oropharynx, nasopharynx, external ear canals are unremarkable. Skin: Warm and dry, no rashes noted.  There is a suspicious lesion on her right upper shoulder, approximately 1.5 cm across, rounded, with pearly edges and underlying telangiectasias concerning for basal cell carcinoma. Cardiac: Regular rate and rhythm, no murmurs rubs or gallops.  Respiratory: Clear to auscultation bilaterally. Not using accessory muscles, speaking in full sentences.  Abdominal: Soft, nontender, nondistended, positive bowel sounds, no masses, no organomegaly.  Musculoskeletal: Shoulder, elbow, wrist, hip,  knee, ankle stable, and with full range of motion.  Impression and Recommendations:    The patient was counselled, risk factors were discussed, anticipatory guidance given.  Annual physical exam Annual physical as above. Checking routine labs. Tdap today. She recently got cervical cancer screening done so she should be up-to-date on everything.  Skin lesion of right shoulder Aprel also has a suspicious skin lesion on the top of her right shoulder, it has pearly raised edges, circular, challenge ectasias concerning for basal cell carcinoma. I would like her to return for shave biopsy with aggressive hyfrecation, I can do this in a 15-minute slot.   ___________________________________________ [name]. [name], M.D., ABFM., CAQSM. Primary Care and Sports Medicine [facility]  Adjunct Professor of [place] of [facility] of [employer]

## 2019-11-21 NOTE — Progress Notes (Signed)
   Kelly Tapia [birth year] [id number]   History:  39 y.o. G0 presents for annual exam without GYN complaints.  Monthly cycle on NuvaRing.  2017 LGSIL with negative 16, 18, 45 and negative colposcopy and biopsy.  2019 ASCUS with negative high-risk HPV.  Received 2 doses of Gardasil.  Sexually active with same partner, denies need for STD screen.  Gynecologic History Patient's last menstrual period was 10/24/2019. Period Cycle (Days): 28 Period Duration (Days): 5 Period Pattern: Regular Menstrual Flow: Light Dysmenorrhea: (!) Mild Dysmenorrhea Symptoms: Cramping Contraception: NuvaRing vaginal inserts Last Pap: 09/21/2018. Results were: normal  Past medical history, past surgical history, family history and social history were all reviewed and documented in the EPIC chart.  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  ROS:  A ROS was performed and pertinent positives and negatives are included.  Exam:  Vitals:   11/21/19 1156  BP: 116/70  Weight: 181 lb (82.1 kg)  Height: 5' 6.5" (1.689 m)   Body mass index is 28.78 kg/m.  General appearance:  Normal Thyroid:  Symmetrical, normal in size, without palpable masses or nodularity. Respiratory  Auscultation:  Clear without wheezing or rhonchi Cardiovascular  Auscultation:  Regular rate, without rubs, murmurs or gallops  Edema/varicosities:  Not grossly evident Abdominal  Soft,nontender, without masses, guarding or rebound.  Liver/spleen:  No organomegaly noted  Hernia:  None appreciated  Skin  Inspection:  Grossly normal   Breasts: Examined lying and sitting.   Right: Without masses, retractions, discharge or axillary adenopathy.   Left: Without masses, retractions, discharge or axillary adenopathy. Gentitourinary   Inguinal/mons:  Normal without inguinal adenopathy  External genitalia:  Normal  BUS/Urethra/Skene's glands:  Normal  Vagina:  Normal  Cervix:  Normal  Uterus:  Anteverted, normal in size, shape and contour.  Midline  and mobile  Adnexa/parametria:     Rt: Without masses or tenderness.   Lt: Without masses or tenderness.  Anus and perineum: Normal  Assessment/Plan:  39 y.o. G0 for annual exam.    Well female exam with routine gynecological exam - Education provided on SBEs, importance of preventative screenings, current guidelines, high calcium diet, regular exercise, and multivitamin daily. Has PCP appointment this afternoon and plans to do labs then  Encounter for surveillance of other contraceptive - Plan: etonogestrel-ethinyl estradiol (NUVARING) 0.12-0.015 MG/24HR vaginal ring  Menorrhagia with regular cycle - well controlled on contraception   Follow-up in 1 year for annual    [place], 12:15 PM 11/21/2019

## 2019-11-21 NOTE — Addendum Note (Signed)
Addended by: Rory Percy on: 11/21/2019 03:48 PM   Modules accepted: Orders

## 2019-11-24 ENCOUNTER — Other Ambulatory Visit: Payer: Self-pay | Admitting: Sports Medicine

## 2019-11-25 LAB — CBC/DIFF AMBIGUOUS DEFAULT
Basophils Absolute: 0 10*3/uL (ref 0.0–0.2)
Basos: 0 %
EOS (ABSOLUTE): 0 10*3/uL (ref 0.0–0.4)
Eos: 1 %
Hematocrit: 35.6 % (ref 34.0–46.6)
Hemoglobin: 11.7 g/dL (ref 11.1–15.9)
Immature Grans (Abs): 0 10*3/uL (ref 0.0–0.1)
Immature Granulocytes: 0 %
Lymphocytes Absolute: 1.3 10*3/uL (ref 0.7–3.1)
Lymphs: 28 %
MCH: 31 pg (ref 26.6–33.0)
MCHC: 32.9 g/dL (ref 31.5–35.7)
MCV: 94 fL (ref 79–97)
Monocytes Absolute: 0.6 10*3/uL (ref 0.1–0.9)
Monocytes: 12 %
Neutrophils Absolute: 2.6 10*3/uL (ref 1.4–7.0)
Neutrophils: 59 %
Platelets: 287 10*3/uL (ref 150–450)
RBC: 3.78 x10E6/uL (ref 3.77–5.28)
RDW: 11.9 % (ref 11.7–15.4)
WBC: 4.5 10*3/uL (ref 3.4–10.8)

## 2019-11-25 LAB — APOLIPOPROTEIN B: Apolipoprotein B: 84 mg/dL (ref <90)

## 2019-11-25 LAB — LIPID CASCADE W/RFLX TO APOLIB
Cholesterol, Total: 196 mg/dL (ref 100–199)
HDL: 82 mg/dL (ref >39)
LDL Chol Calc (NIH): 102 mg/dL — ABNORMAL HIGH (ref 0–99)
LDL/HDL Ratio: 1.2 ratio (ref 0.0–3.2)
Total Non-HDL-Chol (LDL+VLDL): 114 mg/dL (ref 0–129)
Triglycerides: 65 mg/dL (ref 0–149)

## 2019-11-25 LAB — SPECIMEN STATUS REPORT

## 2019-11-25 LAB — TSH: TSH: 1.34 u[IU]/mL (ref 0.450–4.500)

## 2019-11-25 LAB — VITAMIN D 25 HYDROXY (VIT D DEFICIENCY, FRACTURES): Vit D, 25-Hydroxy: 24.1 ng/mL — ABNORMAL LOW (ref 30.0–100.0)

## 2019-11-28 ENCOUNTER — Other Ambulatory Visit: Payer: Self-pay | Admitting: Sports Medicine

## 2019-11-28 ENCOUNTER — Encounter: Payer: Self-pay | Admitting: Sports Medicine

## 2019-11-28 DIAGNOSIS — E559 Vitamin D deficiency, unspecified: Secondary | ICD-10-CM

## 2019-11-28 MED ORDER — VITAMIN D (ERGOCALCIFEROL) 1.25 MG (50000 UNIT) PO CAPS: 50000.0000 [IU] | ORAL_CAPSULE | ORAL | 0 refills | Status: DC

## 2019-11-28 MED FILL — VIT D2 1.25 MG (50,000 UNIT: 1.25 MG | 56 days supply | Qty: 8 | Fill #0

## 2019-12-06 DIAGNOSIS — Z0000 Encounter for general adult medical examination without abnormal findings: Secondary | ICD-10-CM

## 2019-12-07 ENCOUNTER — Ambulatory Visit: Payer: BLUE CROSS/BLUE SHIELD | Admitting: Sports Medicine

## 2019-12-10 LAB — COMPREHENSIVE METABOLIC PANEL
ALT: 21 IU/L (ref 0–32)
AST: 24 IU/L (ref 0–40)
Albumin/Globulin Ratio: 1.5 (ref 1.2–2.2)
Albumin: 4.1 g/dL (ref 3.8–4.8)
Alkaline Phosphatase: 51 IU/L (ref 48–121)
BUN/Creatinine Ratio: 12 (ref 9–23)
BUN: 10 mg/dL (ref 6–20)
Bilirubin Total: 0.5 mg/dL (ref 0.0–1.2)
CO2: 22 mmol/L (ref 20–29)
Calcium: 9.2 mg/dL (ref 8.7–10.2)
Chloride: 103 mmol/L (ref 96–106)
Creatinine, Ser: 0.81 mg/dL (ref 0.57–1.00)
GFR calc Af Amer: 107 mL/min/{1.73_m2} (ref >59)
GFR calc non Af Amer: 93 mL/min/{1.73_m2} (ref >59)
Globulin, Total: 2.7 g/dL (ref 1.5–4.5)
Glucose: 91 mg/dL (ref 65–99)
Potassium: 4.8 mmol/L (ref 3.5–5.2)
Sodium: 137 mmol/L (ref 134–144)
Total Protein: 6.8 g/dL (ref 6.0–8.5)

## 2019-12-14 ENCOUNTER — Other Ambulatory Visit: Payer: Self-pay

## 2019-12-14 ENCOUNTER — Other Ambulatory Visit: Payer: Self-pay | Admitting: Sports Medicine

## 2019-12-14 ENCOUNTER — Ambulatory Visit (INDEPENDENT_AMBULATORY_CARE_PROVIDER_SITE_OTHER): Payer: BLUE CROSS/BLUE SHIELD | Admitting: Sports Medicine

## 2019-12-14 DIAGNOSIS — E559 Vitamin D deficiency, unspecified: Secondary | ICD-10-CM | POA: Diagnosis not present

## 2019-12-14 DIAGNOSIS — L989 Disorder of the skin and subcutaneous tissue, unspecified: Secondary | ICD-10-CM

## 2019-12-14 DIAGNOSIS — C44612 Basal cell carcinoma of skin of right upper limb, including shoulder: Secondary | ICD-10-CM

## 2019-12-14 MED ORDER — VITAMIN D (ERGOCALCIFEROL) 1.25 MG (50000 UNIT) PO CAPS: 50000.0000 [IU] | ORAL_CAPSULE | ORAL | 0 refills | Status: DC

## 2019-12-14 NOTE — Progress Notes (Addendum)
    Procedures performed today:    Procedure: Shave biopsy of 2.1 cm right upper shoulder suspicious lesion Risks, benefits, and alternatives explained and consent obtained. Time out conducted. Surface prepped with alcohol. 4cc lidocaine with epinephine infiltrated in a field block. Adequate anesthesia ensured. Area prepped and draped in a sterile fashion. Excision performed with: Using a derma blade I made a shave into the deep dermis, I took the entirety of the skin lesion, we then used a Hyfrecator to achieve hemostasis and destroy any additional cells at the wound bed. Hemostasis achieved. Pt stable.  Independent interpretation of notes and tests performed by another provider:   None.  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 We performed a shave biopsy with aggressive hyfrecation at the wound base and at the edges. She will do routine wound care, and we will await Derm path.  Update 12/16/2019: The skin lesion was a basal cell carcinoma, on the report the lesion extends to the margins but because we performed aggressive hyfrecation I do not think an additional excision is needed.  We will keep an eye out for any recurrence.    ___________________________________________ [name]. [name], M.D., ABFM., CAQSM. Primary Care and [place] Instructor of [place] of [facility] of Medicine

## 2019-12-14 NOTE — Assessment & Plan Note (Addendum)
Kelly Tapia did have a suspicious skin lesion on the top of her right shoulder, pearly with raised edges, circular, there were telangiectasias underneath it all concerning for basal cell carcinoma. We performed a shave biopsy with aggressive hyfrecation at the wound base and at the edges. She will do routine wound care, and we will await Derm path.  Update 12/16/2019: The skin lesion was a basal cell carcinoma, on the report the lesion extends to the margins but because we performed aggressive hyfrecation I do not think an additional excision is needed.  We will keep an eye out for any recurrence.

## 2019-12-14 NOTE — Addendum Note (Signed)
Addended by: Beatris Ship L on: 12/14/2019 11:05 AM   Modules accepted: Orders

## 2019-12-28 ENCOUNTER — Other Ambulatory Visit: Payer: Self-pay

## 2019-12-28 ENCOUNTER — Ambulatory Visit (INDEPENDENT_AMBULATORY_CARE_PROVIDER_SITE_OTHER): Payer: BLUE CROSS/BLUE SHIELD | Admitting: Sports Medicine

## 2019-12-28 ENCOUNTER — Encounter: Payer: Self-pay | Admitting: Sports Medicine

## 2019-12-28 DIAGNOSIS — C44612 Basal cell carcinoma of skin of right upper limb, including shoulder: Secondary | ICD-10-CM

## 2019-12-28 NOTE — Assessment & Plan Note (Signed)
Kelly Tapia returns, I removed a large lesion from her right shoulder, dermatopathology showed a basal cell carcinoma, on the report the lesion extended to the margin however we performed aggressive hyfrecation of the entire area, we will keep a close eye for recurrence. She will wear SPF 30-50 and higher and let me know if she has any recurrence at the original location or other locations. Return as needed.

## 2019-12-28 NOTE — Progress Notes (Signed)
    Procedures performed today:    None.  Independent interpretation of notes and tests performed by another provider:   None.  Brief History, Exam, Impression, and Recommendations:    Basal cell carcinoma (BCC) of right shoulder Tigerlily returns, I removed a large lesion from her right shoulder, dermatopathology showed a basal cell carcinoma, on the report the lesion extended to the margin however we performed aggressive hyfrecation of the entire area, we will keep a close eye for recurrence. She will wear SPF 30-50 and higher and let me know if she has any recurrence at the original location or other locations. Return as needed.    ___________________________________________ [name]. [name], M.D., ABFM., CAQSM. Primary Care and [place] Instructor of [place] of [facility] [employer]

## 2020-02-07 ENCOUNTER — Other Ambulatory Visit: Payer: Self-pay | Admitting: Sports Medicine

## 2020-02-07 DIAGNOSIS — E559 Vitamin D deficiency, unspecified: Secondary | ICD-10-CM

## 2020-09-11 ENCOUNTER — Telehealth: Payer: BLUE CROSS/BLUE SHIELD | Admitting: Physician Assistant

## 2020-09-11 ENCOUNTER — Encounter: Payer: Self-pay | Admitting: Physician Assistant

## 2020-09-11 DIAGNOSIS — N390 Urinary tract infection, site not specified: Secondary | ICD-10-CM

## 2020-09-11 MED ORDER — NITROFURANTOIN MONOHYD MACRO 100 MG PO CAPS: 100.0000 mg | ORAL_CAPSULE | Freq: Two times a day (BID) | ORAL | 0 refills | Status: DC

## 2020-09-11 NOTE — Progress Notes (Signed)
Ms. Kelly Tapia, Kelly Tapia are scheduled for a virtual visit with your provider today.    Just as we do with appointments in the office, we must obtain your consent to participate.  Your consent will be active for this visit and any virtual visit you may have with one of our providers in the next 365 days.    If you have a MyChart account, I can also send a copy of this consent to you electronically.  All virtual visits are billed to your insurance company just like a traditional visit in the office.  As this is a virtual visit, video technology does not allow for your provider to perform a traditional examination.  This may limit your provider's ability to fully assess your condition.  If your provider identifies any concerns that need to be evaluated in person or the need to arrange testing such as labs, EKG, etc, we will make arrangements to do so.    Although advances in technology are sophisticated, we cannot ensure that it will always work on either your end or our end.  If the connection with a video visit is poor, we may have to switch to a telephone visit.  With either a video or telephone visit, we are not always able to ensure that we have a secure connection.   I need to obtain your verbal consent now.   Are you willing to proceed with your visit today?   Kelly Tapia has provided verbal consent on 09/11/2020 for a virtual visit (video or telephone).   [name], PA-C 09/11/2020  5:54 PM   Virtual Visit via Video   I connected with patient on 09/11/20 at  6:00 PM EDT by a video enabled telemedicine application and verified that I am speaking with the correct person using two identifiers.  Location patient: Home Location provider: [place] participating in the virtual visit: Patient, Provider  I discussed the limitations of evaluation and management by telemedicine and the availability of in person appointments. The patient expressed understanding and  agreed to proceed.  Subjective:   HPI:   Patient presents via [place] today c/o dysuria, urgency and frequency starting yesterday evening. Notes history of UTI but has been a while. Notes some mild hematuria. Denies fever, chills, N/V/D, abdominopelvic pain or flank pain. Denies vaginal pain, discharge or concern of STI.  Denies recent antibiotic use. Is hydrating well.    ROS:   See pertinent positives and negatives per HPI.  Patient Active Problem List   Diagnosis Date Noted  . Vitamin D deficiency 11/28/2019  . Annual physical exam 11/21/2019  . Rotator cuff impingement syndrome of right shoulder 01/11/2016  . Right tennis elbow 01/11/2016  . Abnormal weight gain 02/02/2015  . Menorrhagia 12/22/2012  . Basal cell carcinoma (BCC) of right shoulder 11/19/2012    Social History   Tobacco Use  . Smoking status: Former Smoker    Types: Cigarettes    Quit date: 12/15/2003    Years since quitting: 16.7  . Smokeless tobacco: Never Used  Substance Use Topics  . Alcohol use: Not Currently    Current Outpatient Medications:  .  etonogestrel-ethinyl estradiol (NUVARING) 0.12-0.015 MG/24HR vaginal ring, Insert vaginally and leave in place for 3 consecutive weeks, then remove for 1 week., Disp: 3 each, Rfl: 4 .  ibuprofen (ADVIL,MOTRIN) 400 MG tablet, Take 400 mg by mouth every 6 (six) hours as needed., Disp: , Rfl:  .  Multiple Vitamin (MULTIVITAMIN) tablet, Take  1 tablet by mouth daily.  , Disp: , Rfl:  .  Phentermine HCl 37.5 MG TBDP, Take 1 tablet by mouth daily. (K-25 tabs please), Disp: 30 tablet, Rfl: 0 .  Vitamin D, Ergocalciferol, (DRISDOL) 1.25 MG (50000 UNIT) CAPS capsule, Take 1 capsule (50,000 Units total) by mouth every 7 (seven) days. Take for 8 total doses(weeks), Disp: 8 capsule, Rfl: 0  Allergies  Allergen Reactions  . Penicillins Rash    Objective:   There were no vitals taken for this visit.  Patient is well-developed, well-nourished in no acute distress.   Resting comfortably  at home.  Head is normocephalic, atraumatic.  No labored breathing.  Speech is clear and coherent with logical content.  Patient is alert and oriented at baseline.   Assessment and Plan:   1. UTI (urinary tract infection), uncomplicated No alarm signs or symptoms. Classic UTI symptoms present. Will treat empirically with Macrobid. Supportive measures and OTC medications reviewed. Strict follow-up precautions discussed with patient including when to be evaluated in person or follow-up with PCP. Instructions also sent to her MyChart.  - nitrofurantoin, macrocrystal-monohydrate, (MACROBID) 100 MG capsule; Take 1 capsule (100 mg total) by mouth 2 (two) times daily.  Dispense: 10 capsule; Refill: 0 .   [name], [place] 09/11/2020

## 2020-09-11 NOTE — Patient Instructions (Signed)
Instructions sent to patients MyChart.

## 2020-11-29 ENCOUNTER — Other Ambulatory Visit: Payer: Self-pay | Admitting: Nurse Practitioner

## 2020-11-29 DIAGNOSIS — Z3049 Encounter for surveillance of other contraceptives: Secondary | ICD-10-CM

## 2020-12-13 ENCOUNTER — Other Ambulatory Visit: Payer: Self-pay | Admitting: Nurse Practitioner

## 2020-12-13 DIAGNOSIS — Z3049 Encounter for surveillance of other contraceptives: Secondary | ICD-10-CM

## 2020-12-14 ENCOUNTER — Other Ambulatory Visit: Payer: Self-pay | Admitting: *Deleted

## 2020-12-14 DIAGNOSIS — Z3049 Encounter for surveillance of other contraceptives: Secondary | ICD-10-CM

## 2020-12-14 MED ORDER — ETONOGESTREL-ETHINYL ESTRADIOL 0.12-0.015 MG/24HR VA RING: VAGINAL_RING | VAGINAL | 0 refills | Status: DC

## 2020-12-14 NOTE — Telephone Encounter (Signed)
Patient annual exam scheduled on 02/06/21, Rx was approved earlier today,however the pharmacy sent another request asking for DX: code to Rx. New Rx sent with information attached.

## 2021-02-06 ENCOUNTER — Ambulatory Visit: Payer: BLUE CROSS/BLUE SHIELD | Admitting: Nurse Practitioner

## 2021-02-28 ENCOUNTER — Other Ambulatory Visit: Payer: Self-pay | Admitting: Nurse Practitioner

## 2021-02-28 DIAGNOSIS — Z3049 Encounter for surveillance of other contraceptives: Secondary | ICD-10-CM

## 2021-02-28 NOTE — Telephone Encounter (Signed)
Annual exam scheduled on 04/01/21

## 2021-04-01 ENCOUNTER — Other Ambulatory Visit: Payer: Self-pay

## 2021-04-01 ENCOUNTER — Encounter: Payer: Self-pay | Admitting: Nurse Practitioner

## 2021-04-01 ENCOUNTER — Other Ambulatory Visit (HOSPITAL_COMMUNITY)
Admission: RE | Admit: 2021-04-01 | Discharge: 2021-04-01 | Disposition: A | Discharge status: Home / Self Care | Payer: BLUE CROSS/BLUE SHIELD | Source: Ambulatory Visit | Attending: Nurse Practitioner | Admitting: Nurse Practitioner

## 2021-04-01 ENCOUNTER — Ambulatory Visit (INDEPENDENT_AMBULATORY_CARE_PROVIDER_SITE_OTHER): Payer: BLUE CROSS/BLUE SHIELD | Admitting: Nurse Practitioner

## 2021-04-01 VITALS — BP 124/82 | Ht 66.0 in | Wt 211.0 lb

## 2021-04-01 DIAGNOSIS — Z01419 Encounter for gynecological examination (general) (routine) without abnormal findings: Secondary | ICD-10-CM | POA: Insufficient documentation

## 2021-04-01 DIAGNOSIS — E559 Vitamin D deficiency, unspecified: Secondary | ICD-10-CM | POA: Diagnosis not present

## 2021-04-01 DIAGNOSIS — Z3049 Encounter for surveillance of other contraceptives: Secondary | ICD-10-CM | POA: Diagnosis not present

## 2021-04-01 DIAGNOSIS — Z3044 Encounter for surveillance of vaginal ring hormonal contraceptive device: Secondary | ICD-10-CM

## 2021-04-01 MED ORDER — ETONOGESTREL-ETHINYL ESTRADIOL 0.12-0.015 MG/24HR VA RING: VAGINAL_RING | VAGINAL | 3 refills | Status: DC

## 2021-04-01 NOTE — Progress Notes (Signed)
   Kelly Tapia [birth year] [id number]   History:  40 y.o. G0 presents for annual exam without GYN complaints.  Monthly cycle on NuvaRing.  2017 LGSIL with negative 16, 18, 45 and negative colposcopy and biopsy.  2019 ASCUS with negative high-risk HPV.  Gynecologic History Patient's last menstrual period was 03/04/2021. Period Cycle (Days): 28 Period Duration (Days): 3 Period Pattern: Regular Menstrual Flow: Light Dysmenorrhea: None Contraception: NuvaRing vaginal inserts Sexually active: Not currently  Health Maintenance Last Pap: 09/21/2018. Results were: Normal, 3-year repeat Last mammogram: Not indicated Last colonoscopy: Not indicated Last Dexa: Not indicated  Past medical history, past surgical history, family history and social history were all reviewed and documented in the EPIC chart. 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  ROS:  A ROS was performed and pertinent positives and negatives are included.  Exam:  Vitals:   04/01/21 1401  BP: 124/82  Weight: 211 lb (95.7 kg)  Height: 5\' 6"  (1.676 m)    Body mass index is 34.06 kg/m.  General appearance:  Normal Thyroid:  Symmetrical, normal in size, without palpable masses or nodularity. Respiratory  Auscultation:  Clear without wheezing or rhonchi Cardiovascular  Auscultation:  Regular rate, without rubs, murmurs or gallops  Edema/varicosities:  Not grossly evident Abdominal  Soft,nontender, without masses, guarding or rebound.  Liver/spleen:  No organomegaly noted  Hernia:  None appreciated  Skin  Inspection:  Grossly normal   Breasts: Examined lying and sitting.   Right: Without masses, retractions, discharge or axillary adenopathy.   Left: Without masses, retractions, discharge or axillary adenopathy. Genitourinary   Inguinal/mons:  Normal without inguinal adenopathy  External genitalia:  Normal appearing vulva with no masses, tenderness, or lesions  BUS/Urethra/Skene's glands:  Normal  Vagina:   Normal appearing with normal color and discharge, no lesions  Cervix:  Normal appearing without discharge or lesions  Uterus:  Normal in size, shape and contour.  Midline and mobile, nontender  Adnexa/parametria:     Rt: Normal in size, without masses or tenderness.   Lt: Normal in size, without masses or tenderness.  Anus and perineum: Normal  Patient informed chaperone available to be present for breast and pelvic exam. Patient has requested no chaperone to be present. Patient has been advised what will be completed during breast and pelvic exam.   Assessment/Plan:  40 y.o. G0 for annual exam.    Well female exam with routine gynecological exam - Plan: Cytology - PAP( [facility]), CBC with Differential/Platelet, Comprehensive metabolic panel. Education provided on SBEs, importance of preventative screenings, current guidelines, high calcium diet, regular exercise, and multivitamin daily. Normally does lab work with PCP but has not been this year, would like done today.   Encounter for surveillance of vaginal ring hormonal contraceptive device - Plan: etonogestrel-ethinyl estradiol (NUVARING) 0.12-0.015 MG/24HR vaginal ring. Using as prescribed . Refill x 1 year provided.   Vitamin D deficiency - Plan: VITAMIN D 25 Hydroxy (Vit-D Deficiency, Fractures)  Screening for cervical cancer - 2017 LGSIL with negative 16, 18, 45 and negative colposcopy and biopsy.  2019 ASCUS with negative high-risk HPV. Pap today because she will be overdue at next annual visit.    Follow-up in 1 year for annual.    Tamela Gammon [facility], 2:21 PM 04/01/2021

## 2021-04-02 LAB — CYTOLOGY - PAP
Adequacy: ABSENT
Comment: NEGATIVE
Diagnosis: NEGATIVE
High risk HPV: NEGATIVE

## 2021-04-02 LAB — CBC WITH DIFFERENTIAL/PLATELET
Absolute Monocytes: 639 cells/uL (ref 200–950)
Basophils Absolute: 20 cells/uL (ref 0–200)
Basophils Relative: 0.3 %
Eosinophils Absolute: 27 cells/uL (ref 15–500)
Eosinophils Relative: 0.4 %
HCT: 36.2 % (ref 35.0–45.0)
Hemoglobin: 12.2 g/dL (ref 11.7–15.5)
Lymphs Abs: 2135 cells/uL (ref 850–3900)
MCH: 32.2 pg (ref 27.0–33.0)
MCHC: 33.7 g/dL (ref 32.0–36.0)
MCV: 95.5 fL (ref 80.0–100.0)
MPV: 10.7 fL (ref 7.5–12.5)
Monocytes Relative: 9.4 %
Neutro Abs: 3978 cells/uL (ref 1500–7800)
Neutrophils Relative %: 58.5 %
Platelets: 318 10*3/uL (ref 140–400)
RBC: 3.79 10*6/uL — ABNORMAL LOW (ref 3.80–5.10)
RDW: 12 % (ref 11.0–15.0)
Total Lymphocyte: 31.4 %
WBC: 6.8 10*3/uL (ref 3.8–10.8)

## 2021-04-02 LAB — COMPREHENSIVE METABOLIC PANEL
AG Ratio: 1.3 (calc) (ref 1.0–2.5)
ALT: 30 U/L — ABNORMAL HIGH (ref 6–29)
AST: 55 U/L — ABNORMAL HIGH (ref 10–30)
Albumin: 3.8 g/dL (ref 3.6–5.1)
Alkaline phosphatase (APISO): 47 U/L (ref 31–125)
BUN: 14 mg/dL (ref 7–25)
CO2: 25 mmol/L (ref 20–32)
Calcium: 9.4 mg/dL (ref 8.6–10.2)
Chloride: 103 mmol/L (ref 98–110)
Creat: 0.8 mg/dL (ref 0.50–0.97)
Globulin: 3 g/dL (calc) (ref 1.9–3.7)
Glucose, Bld: 86 mg/dL (ref 65–99)
Potassium: 4.5 mmol/L (ref 3.5–5.3)
Sodium: 139 mmol/L (ref 135–146)
Total Bilirubin: 0.4 mg/dL (ref 0.2–1.2)
Total Protein: 6.8 g/dL (ref 6.1–8.1)

## 2021-04-02 LAB — VITAMIN D 25 HYDROXY (VIT D DEFICIENCY, FRACTURES): Vit D, 25-Hydroxy: 19 ng/mL — ABNORMAL LOW (ref 30–100)

## 2021-11-13 ENCOUNTER — Other Ambulatory Visit: Payer: Self-pay | Admitting: Nurse Practitioner

## 2021-11-13 DIAGNOSIS — Z3044 Encounter for surveillance of vaginal ring hormonal contraceptive device: Secondary | ICD-10-CM

## 2021-11-21 ENCOUNTER — Telehealth: Payer: BLUE CROSS/BLUE SHIELD | Admitting: Physician Assistant

## 2021-11-21 ENCOUNTER — Telehealth: Payer: BLUE CROSS/BLUE SHIELD

## 2021-11-21 DIAGNOSIS — R3989 Other symptoms and signs involving the genitourinary system: Secondary | ICD-10-CM

## 2021-11-21 MED ORDER — SULFAMETHOXAZOLE-TRIMETHOPRIM 800-160 MG PO TABS: 1.0000 | ORAL_TABLET | Freq: Two times a day (BID) | ORAL | 0 refills | Status: DC

## 2021-11-21 NOTE — Progress Notes (Signed)
E-Visit for Urinary Problems  We are sorry that you are not feeling well.  Here is how we plan to help!  Based on what you shared with me it looks like you most likely have a simple urinary tract infection.  A UTI (Urinary Tract Infection) is a bacterial infection of the bladder.  Most cases of urinary tract infections are simple to treat but a key part of your care is to encourage you to drink plenty of fluids and watch your symptoms carefully.  I have prescribed Bactrim DS One tablet twice a day for 5 days.  Your symptoms should gradually improve. Call us if the burning in your urine worsens, you develop worsening fever, back pain or pelvic pain or if your symptoms do not resolve after completing the antibiotic.  Urinary tract infections can be prevented by drinking plenty of water to keep your body hydrated.  Also be sure when you wipe, wipe from front to back and don't hold it in!  If possible, empty your bladder every 4 hours.  HOME CARE Drink plenty of fluids Compete the full course of the antibiotics even if the symptoms resolve Remember, when you need to go.go. Holding in your urine can increase the likelihood of getting a UTI! GET HELP RIGHT AWAY IF: You cannot urinate You get a high fever Worsening back pain occurs You see blood in your urine You feel sick to your stomach or throw up You feel like you are going to pass out  MAKE SURE YOU  Understand these instructions. Will watch your condition. Will get help right away if you are not doing well or get worse.   Thank you for choosing an e-visit.  Your e-visit answers were reviewed by a board certified advanced clinical practitioner to complete your personal care plan. Depending upon the condition, your plan could have included both over the counter or prescription medications.  Please review your pharmacy choice. Make sure the pharmacy is open so you can pick up prescription now. If there is a problem, you may contact  your provider through [employer] and have the prescription routed to another pharmacy.  Your safety is important to [place]. If you have drug allergies check your prescription carefully.   For the next 24 hours you can use MyChart to ask questions about today's visit, request a non-urgent call back, or ask for a work or school excuse. You will get an email in the next two days asking about your experience. I hope that your e-visit has been valuable and will speed your recovery.

## 2021-11-21 NOTE — Progress Notes (Signed)
I have spent 5 minutes in review of e-visit questionnaire, review and updating patient chart, medical decision making and response to patient.   Shalini Mair Cody Ezrie Bunyan, PA-C    

## 2022-05-17 ENCOUNTER — Other Ambulatory Visit: Payer: Self-pay | Admitting: Nurse Practitioner

## 2022-05-17 DIAGNOSIS — Z3044 Encounter for surveillance of vaginal ring hormonal contraceptive device: Secondary | ICD-10-CM

## 2022-05-19 NOTE — Telephone Encounter (Signed)
Med refill request:Nuvaring  Last AEX: 04/01/21 -TW Next AEX:Not scheduled  Last MMG (if hormonal med) None   Call placed to patient, left detailed message, ok per dpr. Advised refill request received for Nuvaring, will send request for 1 ring to Tiffany, NP to review. Will need updated AEX for further refills. Return call to office to schedule.   Routing to Castor, NP

## 2022-06-14 ENCOUNTER — Other Ambulatory Visit: Payer: Self-pay | Admitting: Nurse Practitioner

## 2022-06-14 DIAGNOSIS — Z3044 Encounter for surveillance of vaginal ring hormonal contraceptive device: Secondary | ICD-10-CM

## 2022-06-20 ENCOUNTER — Ambulatory Visit: Payer: BLUE CROSS/BLUE SHIELD | Admitting: Nurse Practitioner

## 2022-06-25 ENCOUNTER — Encounter: Payer: Self-pay | Admitting: Nurse Practitioner

## 2022-06-25 ENCOUNTER — Ambulatory Visit (INDEPENDENT_AMBULATORY_CARE_PROVIDER_SITE_OTHER): Payer: BLUE CROSS/BLUE SHIELD | Admitting: Nurse Practitioner

## 2022-06-25 VITALS — BP 124/80 | HR 79 | Ht 66.75 in | Wt 217.0 lb

## 2022-06-25 DIAGNOSIS — E7800 Pure hypercholesterolemia, unspecified: Secondary | ICD-10-CM

## 2022-06-25 DIAGNOSIS — Z01419 Encounter for gynecological examination (general) (routine) without abnormal findings: Secondary | ICD-10-CM

## 2022-06-25 DIAGNOSIS — Z3044 Encounter for surveillance of vaginal ring hormonal contraceptive device: Secondary | ICD-10-CM

## 2022-06-25 DIAGNOSIS — E559 Vitamin D deficiency, unspecified: Secondary | ICD-10-CM

## 2022-06-25 MED ORDER — ETONOGESTREL-ETHINYL ESTRADIOL 0.12-0.015 MG/24HR VA RING: VAGINAL_RING | VAGINAL | 3 refills | Status: DC

## 2022-06-25 NOTE — Progress Notes (Signed)
   Kelly Tapia April 05, 1981 [id number]   History:  42 y.o. G0 presents for annual exam without GYN complaints.  Monthly cycle on NuvaRing.  2017 LGSIL, negative colposcopy and biopsy.  2019 ASCUS with negative high-risk HPV. Low Vit D last year, did not take recommended supplement.   Gynecologic History Patient's last menstrual period was 06/22/2022 (exact date).   Contraception: NuvaRing vaginal inserts Sexually active: Yes  Health Maintenance Last Pap: 04/01/2021. Results were: Normal neg HPV, 3-year repeat Last mammogram: Never Last colonoscopy: Not indicated Last Dexa: Not indicated  Past medical history, past surgical history, family history and social history were all reviewed and documented in the EPIC chart. 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  ROS:  A ROS was performed and pertinent positives and negatives are included.  Exam:  Vitals:   06/25/22 0905  BP: 124/80  Pulse: 79  SpO2: 99%  Weight: 217 lb (98.4 kg)  Height: 5' 6.75" (1.695 m)     Body mass index is 34.24 kg/m.  General appearance:  Normal Thyroid:  Symmetrical, normal in size, without palpable masses or nodularity. Respiratory  Auscultation:  Clear without wheezing or rhonchi Cardiovascular  Auscultation:  Regular rate, without rubs, murmurs or gallops  Edema/varicosities:  Not grossly evident Abdominal  Soft,nontender, without masses, guarding or rebound.  Liver/spleen:  No organomegaly noted  Hernia:  None appreciated  Skin  Inspection:  Grossly normal   Breasts: Examined lying and sitting.   Right: Without masses, retractions, discharge or axillary adenopathy.   Left: Without masses, retractions, discharge or axillary adenopathy. Genitourinary   Inguinal/mons:  Normal without inguinal adenopathy  External genitalia:  Normal appearing vulva with no masses, tenderness, or lesions  BUS/Urethra/Skene's glands:  Normal  Vagina:  Normal appearing with normal color and discharge, no  lesions  Cervix:  Normal appearing without discharge or lesions  Uterus:  Normal in size, shape and contour.  Midline and mobile, nontender  Adnexa/parametria:     Rt: Normal in size, without masses or tenderness.   Lt: Normal in size, without masses or tenderness.  Anus and perineum: Normal  Patient informed chaperone available to be present for breast and pelvic exam. Patient has requested no chaperone to be present. Patient has been advised what will be completed during breast and pelvic exam.   Assessment/Plan:  42 y.o. G0 for annual exam.    Well female exam with routine gynecological exam - Plan: CBC with Differential/Platelet, Comprehensive metabolic panel. Education provided on SBEs, importance of preventative screenings, current guidelines, high calcium diet, regular exercise, and multivitamin daily. Normally does lab work with PCP but has not been this year, would like done today.   Encounter for surveillance of vaginal ring hormonal contraceptive device - Plan: etonogestrel-ethinyl estradiol (NUVARING) 0.12-0.015 MG/24HR vaginal ring. Using as prescribed . Refill x 1 year provided.   Vitamin D deficiency - Plan: VITAMIN D 25 Hydroxy (Vit-D Deficiency, Fractures).   Elevated LDL cholesterol level - Plan: Lipid panel  Screening for cervical cancer - 2017 LGSIL + HR HPV, negative colposcopy and biopsy.  2019 ASCUS with negative high-risk HPV. Normal, 2020/2022. Will repeat at 3-year interval per guidelines.   Screening for breast cancer - Discussed current guidelines and importance of preventative screenings. Plan to have done somewhere in [place].  Normal breast exam today.   Follow-up in 1 year for annual.    Tamela Gammon [facility], 9:18 AM 06/25/2022

## 2022-06-26 LAB — COMPREHENSIVE METABOLIC PANEL
AG Ratio: 1.3 (calc) (ref 1.0–2.5)
ALT: 33 U/L — ABNORMAL HIGH (ref 6–29)
AST: 25 U/L (ref 10–30)
Albumin: 3.9 g/dL (ref 3.6–5.1)
Alkaline phosphatase (APISO): 58 U/L (ref 31–125)
BUN: 11 mg/dL (ref 7–25)
CO2: 25 mmol/L (ref 20–32)
Calcium: 9.1 mg/dL (ref 8.6–10.2)
Chloride: 102 mmol/L (ref 98–110)
Creat: 0.78 mg/dL (ref 0.50–0.99)
Globulin: 3.1 g/dL (calc) (ref 1.9–3.7)
Glucose, Bld: 86 mg/dL (ref 65–99)
Potassium: 4.8 mmol/L (ref 3.5–5.3)
Sodium: 137 mmol/L (ref 135–146)
Total Bilirubin: 0.4 mg/dL (ref 0.2–1.2)
Total Protein: 7 g/dL (ref 6.1–8.1)

## 2022-06-26 LAB — CBC WITH DIFFERENTIAL/PLATELET
Absolute Monocytes: 532 cells/uL (ref 200–950)
Basophils Absolute: 28 cells/uL (ref 0–200)
Basophils Relative: 0.4 %
Eosinophils Absolute: 77 cells/uL (ref 15–500)
Eosinophils Relative: 1.1 %
HCT: 38.2 % (ref 35.0–45.0)
Hemoglobin: 13 g/dL (ref 11.7–15.5)
Lymphs Abs: 2233 cells/uL (ref 850–3900)
MCH: 31.8 pg (ref 27.0–33.0)
MCHC: 34 g/dL (ref 32.0–36.0)
MCV: 93.4 fL (ref 80.0–100.0)
MPV: 10 fL (ref 7.5–12.5)
Monocytes Relative: 7.6 %
Neutro Abs: 4130 cells/uL (ref 1500–7800)
Neutrophils Relative %: 59 %
Platelets: 385 10*3/uL (ref 140–400)
RBC: 4.09 10*6/uL (ref 3.80–5.10)
RDW: 11.7 % (ref 11.0–15.0)
Total Lymphocyte: 31.9 %
WBC: 7 10*3/uL (ref 3.8–10.8)

## 2022-06-26 LAB — LIPID PANEL
Cholesterol: 218 mg/dL — ABNORMAL HIGH (ref <200)
HDL: 76 mg/dL (ref >=50)
LDL Cholesterol (Calc): 119 mg/dL (calc) — ABNORMAL HIGH
Non-HDL Cholesterol (Calc): 142 mg/dL (calc) — ABNORMAL HIGH (ref <130)
Total CHOL/HDL Ratio: 2.9 (calc) (ref <5.0)
Triglycerides: 119 mg/dL (ref <150)

## 2022-06-26 LAB — VITAMIN D 25 HYDROXY (VIT D DEFICIENCY, FRACTURES): Vit D, 25-Hydroxy: 10 ng/mL — ABNORMAL LOW (ref 30–100)

## 2023-07-03 ENCOUNTER — Other Ambulatory Visit: Payer: Self-pay | Admitting: Nurse Practitioner

## 2023-07-03 DIAGNOSIS — Z3044 Encounter for surveillance of vaginal ring hormonal contraceptive device: Secondary | ICD-10-CM

## 2023-07-03 NOTE — Telephone Encounter (Signed)
Med refill request: Nuvaring  Last AEX: 06/25/22 Next AEX: n/a Last MMG (if hormonal med) n/a Refill authorized: Please Advise,#3, 0 RF

## 2023-12-05 ENCOUNTER — Other Ambulatory Visit: Payer: Self-pay | Admitting: Radiology

## 2023-12-05 DIAGNOSIS — Z3044 Encounter for surveillance of vaginal ring hormonal contraceptive device: Secondary | ICD-10-CM

## 2023-12-07 NOTE — Telephone Encounter (Signed)
 Med refill request: NuvaRing  Last AEX: 06/25/22 TW Next AEX: Not scheduled, sent message to front desk to schedule annual visit Last MMG (if hormonal med)  Refill authorized: Last Rx sent #3 with zero refills on 07/03/23 JC. Please approve or deny.

## 2023-12-08 ENCOUNTER — Other Ambulatory Visit: Payer: Self-pay | Admitting: Radiology

## 2023-12-08 DIAGNOSIS — Z3044 Encounter for surveillance of vaginal ring hormonal contraceptive device: Secondary | ICD-10-CM

## 2023-12-08 NOTE — Telephone Encounter (Signed)
 Med refill request: Nuvaring   Duplicate request.  RF request sent to provider 12/07/23.

## 2024-03-05 ENCOUNTER — Other Ambulatory Visit: Payer: Self-pay | Admitting: Radiology

## 2024-03-05 DIAGNOSIS — Z3044 Encounter for surveillance of vaginal ring hormonal contraceptive device: Secondary | ICD-10-CM

## 2024-03-07 NOTE — Telephone Encounter (Signed)
.  Med refill request: Nuvaring   Last AEX: 06/25/22 Next AEX: Not scheduled sent a message to the front for scheduling  Last MMG (if hormonal med) Not scheduled  Refill authorized: Please Advise?
